# Patient Record
Sex: Male | Born: 1945 | Race: White | Hispanic: No | Marital: Married | Smoking: Never smoker
Health system: Southern US, Community
[De-identification: ages and names within clinical notes are randomized; demographics above are authoritative.]

## PROBLEM LIST (undated history)

## (undated) DIAGNOSIS — E2740 Unspecified adrenocortical insufficiency: Secondary | ICD-10-CM

## (undated) DIAGNOSIS — I10 Essential (primary) hypertension: Secondary | ICD-10-CM

## (undated) DIAGNOSIS — I493 Ventricular premature depolarization: Secondary | ICD-10-CM

## (undated) DIAGNOSIS — H269 Unspecified cataract: Secondary | ICD-10-CM

## (undated) DIAGNOSIS — M545 Low back pain, unspecified: Secondary | ICD-10-CM

## (undated) DIAGNOSIS — D239 Other benign neoplasm of skin, unspecified: Secondary | ICD-10-CM

## (undated) DIAGNOSIS — R7989 Other specified abnormal findings of blood chemistry: Secondary | ICD-10-CM

## (undated) DIAGNOSIS — E7800 Pure hypercholesterolemia, unspecified: Secondary | ICD-10-CM

## (undated) HISTORY — DX: Ventricular premature depolarization: I49.3

## (undated) HISTORY — DX: Unspecified cataract: H26.9

## (undated) HISTORY — DX: Low back pain, unspecified: M54.50

## (undated) HISTORY — DX: Pure hypercholesterolemia, unspecified: E78.00

## (undated) HISTORY — DX: Other benign neoplasm of skin, unspecified: D23.9

## (undated) HISTORY — DX: Other specified abnormal findings of blood chemistry: R79.89

## (undated) HISTORY — PX: RETINAL DETACHMENT SURGERY: SHX105

## (undated) HISTORY — DX: Unspecified adrenocortical insufficiency: E27.40

## (undated) HISTORY — DX: Essential (primary) hypertension: I10

## (undated) HISTORY — DX: Low back pain: M54.5

---

## 2004-08-15 ENCOUNTER — Ambulatory Visit: Payer: Self-pay | Admitting: Gastroenterology

## 2004-08-23 ENCOUNTER — Ambulatory Visit: Payer: Self-pay | Admitting: Gastroenterology

## 2006-02-06 HISTORY — PX: SHOULDER SURGERY: SHX246

## 2007-04-25 ENCOUNTER — Encounter: Admission: RE | Admit: 2007-04-25 | Discharge: 2007-04-25 | Payer: Self-pay | Admitting: Orthopaedic Surgery

## 2008-11-26 ENCOUNTER — Telehealth (INDEPENDENT_AMBULATORY_CARE_PROVIDER_SITE_OTHER): Payer: Self-pay | Admitting: *Deleted

## 2011-02-07 HISTORY — PX: CATARACT EXTRACTION, BILATERAL: SHX1313

## 2011-04-11 ENCOUNTER — Other Ambulatory Visit: Payer: Self-pay | Admitting: Orthopaedic Surgery

## 2011-04-11 DIAGNOSIS — R52 Pain, unspecified: Secondary | ICD-10-CM

## 2011-04-18 ENCOUNTER — Ambulatory Visit
Admission: RE | Admit: 2011-04-18 | Discharge: 2011-04-18 | Disposition: A | Discharge status: Home / Self Care | Payer: BC Managed Care – PPO | Source: Ambulatory Visit | Attending: Orthopaedic Surgery | Admitting: Orthopaedic Surgery

## 2011-04-18 DIAGNOSIS — R52 Pain, unspecified: Secondary | ICD-10-CM

## 2012-01-02 ENCOUNTER — Other Ambulatory Visit: Payer: Self-pay | Admitting: Family Medicine

## 2012-01-02 DIAGNOSIS — M549 Dorsalgia, unspecified: Secondary | ICD-10-CM

## 2012-01-03 ENCOUNTER — Ambulatory Visit
Admission: RE | Admit: 2012-01-03 | Discharge: 2012-01-03 | Disposition: A | Discharge status: Home / Self Care | Payer: BC Managed Care – PPO | Source: Ambulatory Visit | Attending: Family Medicine | Admitting: Family Medicine

## 2012-01-03 DIAGNOSIS — M549 Dorsalgia, unspecified: Secondary | ICD-10-CM

## 2013-02-14 ENCOUNTER — Ambulatory Visit: Payer: BC Managed Care – PPO | Admitting: Cardiovascular Disease

## 2013-03-03 ENCOUNTER — Other Ambulatory Visit: Payer: Self-pay | Admitting: Family Medicine

## 2013-03-03 ENCOUNTER — Ambulatory Visit
Admission: RE | Admit: 2013-03-03 | Discharge: 2013-03-03 | Disposition: A | Discharge status: Home / Self Care | Payer: BC Managed Care – PPO | Source: Ambulatory Visit | Attending: Family Medicine | Admitting: Family Medicine

## 2013-03-03 DIAGNOSIS — R52 Pain, unspecified: Secondary | ICD-10-CM

## 2013-03-25 ENCOUNTER — Ambulatory Visit: Payer: BC Managed Care – PPO | Admitting: Cardiovascular Disease

## 2013-04-23 ENCOUNTER — Ambulatory Visit (INDEPENDENT_AMBULATORY_CARE_PROVIDER_SITE_OTHER): Payer: BC Managed Care – PPO | Admitting: Cardiovascular Disease

## 2013-04-23 ENCOUNTER — Other Ambulatory Visit: Payer: Self-pay | Admitting: *Deleted

## 2013-04-23 ENCOUNTER — Encounter: Payer: Self-pay | Admitting: Cardiovascular Disease

## 2013-04-23 ENCOUNTER — Encounter: Payer: Self-pay | Admitting: *Deleted

## 2013-04-23 VITALS — BP 147/92 | HR 74 | Ht 74.0 in | Wt 211.0 lb

## 2013-04-23 DIAGNOSIS — I493 Ventricular premature depolarization: Secondary | ICD-10-CM

## 2013-04-23 DIAGNOSIS — D239 Other benign neoplasm of skin, unspecified: Secondary | ICD-10-CM | POA: Insufficient documentation

## 2013-04-23 DIAGNOSIS — I10 Essential (primary) hypertension: Secondary | ICD-10-CM | POA: Insufficient documentation

## 2013-04-23 DIAGNOSIS — R5383 Other fatigue: Secondary | ICD-10-CM

## 2013-04-23 DIAGNOSIS — M545 Low back pain, unspecified: Secondary | ICD-10-CM | POA: Insufficient documentation

## 2013-04-23 DIAGNOSIS — E7800 Pure hypercholesterolemia, unspecified: Secondary | ICD-10-CM | POA: Insufficient documentation

## 2013-04-23 DIAGNOSIS — R5381 Other malaise: Secondary | ICD-10-CM

## 2013-04-23 DIAGNOSIS — R0609 Other forms of dyspnea: Secondary | ICD-10-CM

## 2013-04-23 DIAGNOSIS — I4949 Other premature depolarization: Secondary | ICD-10-CM

## 2013-04-23 DIAGNOSIS — R7989 Other specified abnormal findings of blood chemistry: Secondary | ICD-10-CM | POA: Insufficient documentation

## 2013-04-23 DIAGNOSIS — E2740 Unspecified adrenocortical insufficiency: Secondary | ICD-10-CM | POA: Insufficient documentation

## 2013-04-23 DIAGNOSIS — R0989 Other specified symptoms and signs involving the circulatory and respiratory systems: Secondary | ICD-10-CM

## 2013-04-23 DIAGNOSIS — R0600 Dyspnea, unspecified: Secondary | ICD-10-CM

## 2013-04-23 NOTE — Assessment & Plan Note (Signed)
Chronic  Check echo to make sure EF normal and ETT to make sure they don't increase in frequency with exertion

## 2013-04-23 NOTE — Assessment & Plan Note (Signed)
Cholesterol is at goal.  Continue current dose of statin and diet Rx.  No myalgias or side effects.  F/U  LFT's in 6 months. No results found for this basename: LDLCALC   Last LDL 143  Continue diet Rx  F/U Dr Harrington Challenger

## 2013-04-23 NOTE — Assessment & Plan Note (Signed)
Low sodium diet continue current meds  Follow K and Mg regarding PVCls  He is not interested in beta blocker  If home BP's run high he will call to adjust meds

## 2013-04-23 NOTE — Patient Instructions (Signed)
Your physician wants you to follow-up in:  YEAR WITH DR NISHAN You will receive a reminder letter in the mail two months in advance. If you don't receive a letter, please call our office to schedule the follow-up appointment. Your physician recommends that you continue on your current medications as directed. Please refer to the Current Medication list given to you today. Your physician has requested that you have an exercise tolerance test. For further information please visit [email or web address]. Please also follow instruction sheet, as given.  Your physician has requested that you have an echocardiogram. Echocardiography is a painless test that uses sound waves to create images of your heart. It provides your doctor with information about the size and shape of your heart and how well your heart's chambers and valves are working. This procedure takes approximately one hour. There are no restrictions for this procedure.  

## 2013-04-23 NOTE — Progress Notes (Signed)
Patient ID: Alexander Rea., male   DOB: 04-30-1945, 68 y.o.   MRN: [id number]  68 yo previously seen by Dr Marlou Porch.  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 Recent increase in fatigue and exertional dyspnea.  No real chest pain but mild pressure when he does yard work and gets dyspnic.  Having issues with allergies now taking antihistamines  Chronic HTN on Rx White coat component Runs good at home  Denies excess NSAI's , ETOH  Compliant with meds.  Does not note PVCls no palpitations or syncope.  Still working in Production assistant, radio Like to ride [place].  Works out pretty regularly with no chest pain but increasing fatigue and dyspnea last couple of months     ROS: Denies fever, malais, weight loss, blurry vision, decreased visual acuity, cough, sputum, SOB, hemoptysis, pleuritic pain, palpitaitons, heartburn, abdominal pain, melena, lower extremity edema, claudication, or rash.  All other systems reviewed and negative   General: Affect appropriate Healthy:  appears stated age 23: normal Neck supple with no adenopathy JVP normal no bruits no thyromegaly Lungs clear with no wheezing and good diaphragmatic motion Heart:  S1/S2 no murmur,rub, gallop or click PMI normal Abdomen: benighn, BS positve, no tenderness, no AAA no bruit.  No HSM or HJR Distal pulses intact with no bruits No edema Neuro non-focal Skin warm and dry No muscular weakness  Medications Current Outpatient Prescriptions  Medication Sig Dispense Refill  . ANDROGEL 50 MG/5GM (1%) GEL       . DIOVAN HCT 320-25 MG per tablet       . valACYclovir (VALTREX) 1000 MG tablet        No current facility-administered medications for this visit.    Allergies Ciprofloxacin and Penicillins  Family History: No family history on file.  Social History: History   Social History  . Marital Status: Married    Spouse Name: N/A    Number of Children: N/A  . Years of Education: N/A    Occupational History  . Not on file.   Social History Main Topics  . Smoking status: Never Smoker   . Smokeless tobacco: Not on file  . Alcohol Use: Not on file  . Drug Use: Not on file  . Sexual Activity: Not on file   Other Topics Concern  . Not on file   Social History Narrative  . No narrative on file    Electrocardiogram:   SR rate 76  LAD PVC  QT 374    Assessment and Plan

## 2013-05-08 ENCOUNTER — Other Ambulatory Visit: Payer: Self-pay | Admitting: *Deleted

## 2013-05-08 ENCOUNTER — Encounter: Payer: Self-pay | Admitting: Cardiovascular Disease

## 2013-05-08 ENCOUNTER — Ambulatory Visit (INDEPENDENT_AMBULATORY_CARE_PROVIDER_SITE_OTHER): Payer: BC Managed Care – PPO | Admitting: Nurse Practitioner

## 2013-05-08 ENCOUNTER — Encounter: Payer: Self-pay | Admitting: Nurse Practitioner

## 2013-05-08 ENCOUNTER — Ambulatory Visit (HOSPITAL_COMMUNITY): Payer: BC Managed Care – PPO | Attending: Cardiovascular Disease | Admitting: Radiology

## 2013-05-08 VITALS — BP 128/84 | HR 78

## 2013-05-08 DIAGNOSIS — I493 Ventricular premature depolarization: Secondary | ICD-10-CM

## 2013-05-08 DIAGNOSIS — R0989 Other specified symptoms and signs involving the circulatory and respiratory systems: Secondary | ICD-10-CM | POA: Insufficient documentation

## 2013-05-08 DIAGNOSIS — R0600 Dyspnea, unspecified: Secondary | ICD-10-CM

## 2013-05-08 DIAGNOSIS — R5381 Other malaise: Secondary | ICD-10-CM

## 2013-05-08 DIAGNOSIS — I4949 Other premature depolarization: Secondary | ICD-10-CM | POA: Insufficient documentation

## 2013-05-08 DIAGNOSIS — R0609 Other forms of dyspnea: Secondary | ICD-10-CM

## 2013-05-08 DIAGNOSIS — R0602 Shortness of breath: Secondary | ICD-10-CM

## 2013-05-08 DIAGNOSIS — R5383 Other fatigue: Secondary | ICD-10-CM

## 2013-05-08 NOTE — Progress Notes (Signed)
Exercise Treadmill Test  Pre-Exercise Testing Evaluation Rhythm: normal sinus  Rate: 63 bpm     Test  Exercise Tolerance Test Ordering MD: Jenkins Rouge, MD  Interpreting MD: Truitt Merle, NP  Unique Test No: 1  Treadmill:  1  Indication for ETT: exertional dyspnea  Contraindication to ETT: No   Stress Modality: exercise - treadmill  Cardiac Imaging Performed: non   Protocol: standard Bruce - maximal  Max BP:  221/105  Max MPHR (bpm):  155 85% MPR (bpm):  132  MPHR obtained (bpm):  153 % MPHR obtained:  97%  Reached 85% MPHR (min:sec):  7:20 Total Exercise Time (min-sec):  10 minutes  Workload in METS:  11.7 Borg Scale: 17  Reason ETT Terminated:  desired heart rate attained    ST Segment Analysis At Rest: normal ST segments - no evidence of significant ST depression With Exercise: no evidence of significant ST depression  Other Information Arrhythmia:  No Angina during ETT:  absent (0) Quality of ETT:  diagnostic  ETT Interpretation:  normal - no evidence of ischemia by ST analysis  Comments: Patient presents today for routine GXT. Has had chronic PVCs, no known CAD - reports fatigue with exertion.  Has had echo earlier this AM.   Today the patient exercised on the standard Bruce protocol for a total of 10 minutes.  Excellent exercise tolerance.  Mildly hypertensive blood pressure response.  Clinically negative for chest pain. Test was stopped due to achievement of target HR.  EKG negative for ischemia. No significant arrhythmia noted. Does have frequent PVCs at rest, less with exertion, and then noted to recur in recovery. Unifocal in origin.    Recommendations: CV risk factor modification Echo results pending Monitor BP at home.  Follow up as planned  Patient is agreeable to this plan and will call if any problems develop in the interim.   Burtis Junes, RN, [place] [address] [place], [place]   [phone]

## 2013-05-08 NOTE — Telephone Encounter (Signed)
Pt was seen in treadmill room with Truitt Merle, NP and wanted to add pt's supplements:  Osteo Bi-Flex; (1500) gluco simine, (1103) chrondrotin, 2 daily Milk Thistle (240 mg ) daily Fish oil ( 1200 mg) daily  Vit E (1000 mg) daily Vit B (2000) iu daily L-Lysine (1000 mg ) daily Lecithin ( 1200 mg ) daily

## 2013-05-08 NOTE — Progress Notes (Signed)
Echocardiogram Performed. 

## 2013-05-30 ENCOUNTER — Other Ambulatory Visit: Payer: Self-pay | Admitting: Orthopaedic Surgery

## 2013-05-30 DIAGNOSIS — M25561 Pain in right knee: Secondary | ICD-10-CM

## 2013-06-04 ENCOUNTER — Ambulatory Visit
Admission: RE | Admit: 2013-06-04 | Discharge: 2013-06-04 | Disposition: A | Discharge status: Home / Self Care | Payer: BC Managed Care – PPO | Source: Ambulatory Visit | Attending: Orthopaedic Surgery | Admitting: Orthopaedic Surgery

## 2013-06-04 DIAGNOSIS — M25561 Pain in right knee: Secondary | ICD-10-CM

## 2014-04-23 ENCOUNTER — Encounter: Payer: Self-pay | Admitting: Cardiovascular Disease

## 2014-04-23 ENCOUNTER — Ambulatory Visit (INDEPENDENT_AMBULATORY_CARE_PROVIDER_SITE_OTHER): Payer: Managed Care, Other (non HMO) | Admitting: Cardiovascular Disease

## 2014-04-23 VITALS — BP 148/92 | HR 76 | Ht 74.0 in | Wt 211.8 lb

## 2014-04-23 DIAGNOSIS — E7800 Pure hypercholesterolemia, unspecified: Secondary | ICD-10-CM

## 2014-04-23 DIAGNOSIS — I493 Ventricular premature depolarization: Secondary | ICD-10-CM

## 2014-04-23 DIAGNOSIS — I10 Essential (primary) hypertension: Secondary | ICD-10-CM

## 2014-04-23 NOTE — Patient Instructions (Signed)
Your physician wants you to follow-up in: YEAR WITH DR NISHAN  You will receive a reminder letter in the mail two months in advance. If you don't receive a letter, please call our office to schedule the follow-up appointment.  Your physician recommends that you continue on your current medications as directed. Please refer to the Current Medication list given to you today. 

## 2014-04-23 NOTE — Assessment & Plan Note (Signed)
Cholesterol is at goal.  Continue current dose of statin and diet Rx.  No myalgias or side effects.  F/U  LFT's in 6 months. No results found for: [facility]          Labs with primary

## 2014-04-23 NOTE — Progress Notes (Signed)
Patient ID: Alexander Rea., male   DOB: 04-30-1945, 69 y.o.   MRN: [id number]  69 yo previously seen by Dr Marlou Porch.  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 Recent increase in fatigue and exertional dyspnea.  No real chest pain but mild pressure when he does yard work and gets dyspnic.  Having issues with allergies now taking antihistamines  Chronic HTN on Rx White coat component Runs good at home  Denies excess NSAI's , ETOH  Compliant with meds.  Does not note PVCls no palpitations or syncope.  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  Works out pretty regularly with no chest pain but increasing fatigue and dyspnea last couple of months   F/U echo :  Reviewed  Normal  Study Conclusions  Left ventricle: The cavity size was normal. Systolic function was normal. The estimated ejection fraction was in the range of 60% to 65%. Wall motion was normal; there were no regional wall motion abnormalities.  ETT 05/08/13  Normal exercised 10 mintues  Torn ligament left foot has set him back a bit  Seeing Alexander Anderson   ROS: Denies fever, malais, weight loss, blurry vision, decreased visual acuity, cough, sputum, SOB, hemoptysis, pleuritic pain, palpitaitons, heartburn, abdominal pain, melena, lower extremity edema, claudication, or rash.  All other systems reviewed and negative   General: Affect appropriate Healthy:  appears stated age 68: normal Neck supple with no adenopathy JVP normal no bruits no thyromegaly Lungs clear with no wheezing and good diaphragmatic motion Heart:  S1/S2 no murmur,rub, gallop or click PMI normal Abdomen: benighn, BS positve, no tenderness, no AAA no bruit.  No HSM or HJR Distal pulses intact with no bruits No edema Neuro non-focal Skin warm and dry No muscular weakness  Medications Current Outpatient Prescriptions  Medication Sig Dispense Refill  . ANDROGEL 50 MG/5GM (1%) GEL Place onto the skin daily.     Marland Kitchen DIOVAN  HCT 320-25 MG per tablet Take 1 tablet by mouth daily.     . valACYclovir (VALTREX) 1000 MG tablet Take by mouth as needed (for cold sores).      No current facility-administered medications for this visit.    Allergies Ciprofloxacin and Penicillins  Family History: Family History  Problem Relation Age of Onset  . Atrial fibrillation Mother   . Ovarian cancer Mother   . Heart disease Sister     Social History: History   Social History  . Marital Status: Married    Spouse Name: N/A  . Number of Children: N/A  . Years of Education: N/A   Occupational History  . Not on file.   Social History Main Topics  . Smoking status: Never Smoker   . Smokeless tobacco: Not on file  . Alcohol Use: Not on file  . Drug Use: Not on file  . Sexual Activity: Not on file   Other Topics Concern  . Not on file   Social History Narrative    Electrocardiogram:    2015  SR rate 76  LAD PVC  QT 374   04/23/14  Same no change   Assessment and Plan

## 2014-04-23 NOTE — Assessment & Plan Note (Signed)
Well controlled.  Continue current medications and low sodium Dash type diet.    

## 2014-04-23 NOTE — Assessment & Plan Note (Signed)
Resolved normal echo and ETT observe

## 2014-06-05 ENCOUNTER — Encounter: Payer: Self-pay | Admitting: Gastroenterology

## 2014-06-22 ENCOUNTER — Encounter: Payer: Self-pay | Admitting: Gastroenterology

## 2014-08-18 ENCOUNTER — Ambulatory Visit (AMBULATORY_SURGERY_CENTER): Payer: Self-pay

## 2014-08-18 VITALS — Ht 74.0 in | Wt 207.6 lb

## 2014-08-18 DIAGNOSIS — Z1211 Encounter for screening for malignant neoplasm of colon: Secondary | ICD-10-CM

## 2014-08-18 MED ORDER — NA SULFATE-K SULFATE-MG SULF 17.5-3.13-1.6 GM/177ML PO SOLN: ORAL | Status: DC

## 2014-08-18 NOTE — Progress Notes (Signed)
Per pt, no allergies to soy or egg products.Pt not taking any weight loss meds or using  O2 at home. 

## 2014-08-19 ENCOUNTER — Encounter: Payer: Self-pay | Admitting: Gastroenterology

## 2014-09-03 ENCOUNTER — Ambulatory Visit (AMBULATORY_SURGERY_CENTER): Payer: Managed Care, Other (non HMO) | Admitting: Gastroenterology

## 2014-09-03 ENCOUNTER — Encounter: Payer: Self-pay | Admitting: Gastroenterology

## 2014-09-03 VITALS — BP 138/82 | HR 69 | Temp 97.7°F | Resp 20 | Ht 74.0 in | Wt 207.0 lb

## 2014-09-03 DIAGNOSIS — Z1211 Encounter for screening for malignant neoplasm of colon: Secondary | ICD-10-CM | POA: Diagnosis present

## 2014-09-03 MED ORDER — SODIUM CHLORIDE 0.9 % IV SOLN: 500.0000 mL | INTRAVENOUS | Status: DC

## 2014-09-03 NOTE — Progress Notes (Signed)
Transferred to recovery room. A/O x3, pleased with MAC.  VSS.  Report to Annette, RN. 

## 2014-09-03 NOTE — Progress Notes (Signed)
No problems noted in the recovery room. maw 

## 2014-09-03 NOTE — Op Note (Addendum)
[place]  [address] [place], [address]   COLONOSCOPY PROCEDURE REPORT  PATIENT: Alexander Anderson, Alexander Anderson  MR#: [id number] BIRTHDATE: [birth year] , 80  yrs. old GENDER: male ENDOSCOPIST: Inda Castle, MD REFERRED BY: PROCEDURE DATE:  09/03/2014 PROCEDURE:   Colonoscopy, screening First Screening Colonoscopy - Avg.  risk and is 50 yrs.  old or older - No.  Prior Negative Screening - Now for repeat screening. 10 or more years since last screening  History of Adenoma - Now for follow-up colonoscopy & has been > or = to 3 yrs.  N/A  Polyps removed today? No Recommend repeat exam, <10 yrs? No ASA CLASS:   Class II INDICATIONS:Colorectal Neoplasm Risk Assessment for this procedure is average risk. MEDICATIONS: Monitored anesthesia care and Propofol 250 mg IV  DESCRIPTION OF PROCEDURE:   After the risks benefits and alternatives of the procedure were thoroughly explained, informed consent was obtained.  The digital rectal exam revealed no abnormalities of the rectum.   The LB [id number]  endoscope was introduced through the anus and advanced to the cecum, which was identified by both the appendix and ileocecal valve. No adverse events experienced.   The quality of the prep was (Suprep was used) excellent.  The instrument was then slowly withdrawn as the colon was fully examined. Estimated blood loss is zero unless otherwise noted in this procedure report.      COLON FINDINGS: A normal appearing cecum, ileocecal valve, and appendiceal orifice were identified.  The ascending, transverse, descending, sigmoid colon, and rectum appeared unremarkable. Retroflexed views revealed No abnormalities. The time to cecum = 4.4 Withdrawal time = 6.5   The scope was withdrawn and the procedure completed. COMPLICATIONS: There were no immediate complications.  ENDOSCOPIC IMPRESSION:   No normal colonoscopy  RECOMMENDATIONS: Continue current colorectal screening  recommendations for "routine risk" patients with a repeat colonoscopy in 10 years.  eSigned:  Inda Castle, MD 09/03/2014 8:58 AM Revised: 09/03/2014 8:58 AM  cc: Lona Kettle, MD

## 2014-09-03 NOTE — Patient Instructions (Addendum)
YOU HAD AN ENDOSCOPIC PROCEDURE TODAY AT [facility]:   Refer to the procedure report that was given to you for any specific questions about what was found during the examination.  If the procedure report does not answer your questions, please call your gastroenterologist to clarify.  If you requested that your care partner not be given the details of your procedure findings, then the procedure report has been included in a sealed envelope for you to review at your convenience later.  YOU SHOULD EXPECT: Some feelings of bloating in the abdomen. Passage of more gas than usual.  Walking can help get rid of the air that was put into your GI tract during the procedure and reduce the bloating. If you had a lower endoscopy (such as a colonoscopy or flexible sigmoidoscopy) you may notice spotting of blood in your stool or on the toilet paper. If you underwent a bowel prep for your procedure, you may not have a normal bowel movement for a few days.  Please Note:  You might notice some irritation and congestion in your nose or some drainage.  This is from the oxygen used during your procedure.  There is no need for concern and it should clear up in a day or so.  SYMPTOMS TO REPORT IMMEDIATELY:   Following lower endoscopy (colonoscopy or flexible sigmoidoscopy):  Excessive amounts of blood in the stool  Significant tenderness or worsening of abdominal pains  Swelling of the abdomen that is new, acute  Fever of 100F or higher  For urgent or emergent issues, a gastroenterologist can be reached at any hour by calling [phone].   DIET: Your first meal following the procedure should be a small meal and then it is ok to progress to your normal diet. Heavy or fried foods are harder to digest and may make you feel nauseous or bloated.  Likewise, meals heavy in dairy and vegetables can increase bloating.  Drink plenty of fluids but you should avoid alcoholic beverages for 24  hours.  ACTIVITY:  You should plan to take it easy for the rest of today and you should NOT DRIVE or use heavy machinery until tomorrow (because of the sedation medicines used during the test).    FOLLOW UP: Our staff will call the number listed on your records the next business day following your procedure to check on you and address any questions or concerns that you may have regarding the information given to you following your procedure. If we do not reach you, we will leave a message.  However, if you are feeling well and you are not experiencing any problems, there is no need to return our call.  We will assume that you have returned to your regular daily activities without incident.  If any biopsies were taken you will be contacted by phone or by letter within the next 1-3 weeks.  Please call us at [phone] if you have not heard about the biopsies in 3 weeks.    SIGNATURES/CONFIDENTIALITY: You and/or your care partner have signed paperwork which will be entered into your electronic medical record.  These signatures attest to the fact that that the information above on your After Visit Summary has been reviewed and is understood.  Full responsibility of the confidentiality of this discharge information lies with you and/or your care-partner.    You may resume your current medications today. Please call if any questions or concerns.

## 2014-09-04 ENCOUNTER — Telehealth: Payer: Self-pay | Admitting: Emergency Medicine

## 2014-09-04 NOTE — Telephone Encounter (Signed)
  Follow up Call-  Call back number 09/03/2014  Post procedure Call Back phone  # [phone]  Permission to leave phone message Yes     Patient questions:  Do you have a fever, pain , or abdominal swelling? No. Pain Score  0 *  Have you tolerated food without any problems? Yes.    Have you been able to return to your normal activities? Yes.    Do you have any questions about your discharge instructions: Diet   No. Medications  No. Follow up visit  No.  Do you have questions or concerns about your Care? No.  Actions: * If pain score is 4 or above: No action needed, pain <4.

## 2014-09-08 ENCOUNTER — Other Ambulatory Visit: Payer: Self-pay | Admitting: Otolaryngology

## 2014-09-08 ENCOUNTER — Ambulatory Visit
Admission: RE | Admit: 2014-09-08 | Discharge: 2014-09-08 | Disposition: A | Discharge status: Home / Self Care | Payer: Managed Care, Other (non HMO) | Source: Ambulatory Visit | Attending: Otolaryngology | Admitting: Otolaryngology

## 2014-09-08 DIAGNOSIS — J40 Bronchitis, not specified as acute or chronic: Secondary | ICD-10-CM

## 2015-03-15 ENCOUNTER — Telehealth: Payer: Self-pay | Admitting: Oncology

## 2015-03-15 NOTE — Telephone Encounter (Signed)
Lt mess regarding new pt referral.  °

## 2015-03-18 ENCOUNTER — Ambulatory Visit: Payer: Managed Care, Other (non HMO) | Admitting: Oncology

## 2015-03-23 ENCOUNTER — Encounter: Payer: Self-pay | Admitting: Hematology

## 2015-03-23 ENCOUNTER — Ambulatory Visit (HOSPITAL_BASED_OUTPATIENT_CLINIC_OR_DEPARTMENT_OTHER): Payer: Managed Care, Other (non HMO)

## 2015-03-23 ENCOUNTER — Ambulatory Visit (HOSPITAL_BASED_OUTPATIENT_CLINIC_OR_DEPARTMENT_OTHER): Payer: Managed Care, Other (non HMO) | Admitting: Hematology

## 2015-03-23 ENCOUNTER — Telehealth: Payer: Self-pay | Admitting: Hematology

## 2015-03-23 VITALS — BP 150/86 | HR 71 | Temp 98.2°F | Resp 18 | Ht 74.0 in | Wt 209.6 lb

## 2015-03-23 DIAGNOSIS — D72821 Monocytosis (symptomatic): Secondary | ICD-10-CM

## 2015-03-23 DIAGNOSIS — N289 Disorder of kidney and ureter, unspecified: Secondary | ICD-10-CM | POA: Diagnosis not present

## 2015-03-23 DIAGNOSIS — I10 Essential (primary) hypertension: Secondary | ICD-10-CM

## 2015-03-23 LAB — COMPREHENSIVE METABOLIC PANEL
ALT: 23 U/L (ref 0–55)
AST: 21 U/L (ref 5–34)
Albumin: 3.9 g/dL (ref 3.5–5.0)
Alkaline Phosphatase: 69 U/L (ref 40–150)
Anion Gap: 9 mEq/L (ref 3–11)
BUN: 16.2 mg/dL (ref 7.0–26.0)
CO2: 29 mEq/L (ref 22–29)
Calcium: 9.7 mg/dL (ref 8.4–10.4)
Chloride: 102 mEq/L (ref 98–109)
Creatinine: 1.1 mg/dL (ref 0.7–1.3)
EGFR: 70 mL/min/{1.73_m2} — ABNORMAL LOW (ref >90)
Glucose: 92 mg/dl (ref 70–140)
Potassium: 4.2 mEq/L (ref 3.5–5.1)
Sodium: 140 mEq/L (ref 136–145)
Total Bilirubin: 0.46 mg/dL (ref 0.20–1.20)
Total Protein: 7.4 g/dL (ref 6.4–8.3)

## 2015-03-23 LAB — CBC & DIFF AND RETIC
BASO%: 0.4 % (ref 0.0–2.0)
Basophils Absolute: 0 10*3/uL (ref 0.0–0.1)
EOS%: 2.7 % (ref 0.0–7.0)
Eosinophils Absolute: 0.2 10*3/uL (ref 0.0–0.5)
HCT: 46.5 % (ref 38.4–49.9)
HGB: 15.8 g/dL (ref 13.0–17.1)
Immature Retic Fract: 3.4 % (ref 3.00–10.60)
LYMPH%: 21.5 % (ref 14.0–49.0)
MCH: 31.3 pg (ref 27.2–33.4)
MCHC: 34 g/dL (ref 32.0–36.0)
MCV: 92.3 fL (ref 79.3–98.0)
MONO#: 0.8 10*3/uL (ref 0.1–0.9)
MONO%: 11.6 % (ref 0.0–14.0)
NEUT#: 4.5 10*3/uL (ref 1.5–6.5)
NEUT%: 63.8 % (ref 39.0–75.0)
Platelets: 207 10*3/uL (ref 140–400)
RBC: 5.04 10*6/uL (ref 4.20–5.82)
RDW: 12.9 % (ref 11.0–14.6)
Retic %: 1.51 % (ref 0.80–1.80)
Retic Ct Abs: 76.1 10*3/uL (ref 34.80–93.90)
WBC: 7.1 10*3/uL (ref 4.0–10.3)
lymph#: 1.5 10*3/uL (ref 0.9–3.3)

## 2015-03-23 LAB — CHCC SMEAR

## 2015-03-23 NOTE — Progress Notes (Signed)
Marland Kitchen    HEMATOLOGY/ONCOLOGY CONSULTATION NOTE  Date of Service: 03/23/2015  Patient Care Team: Lona Kettle, MD as PCP - General (Family Medicine)  CHIEF COMPLAINTS/PURPOSE OF CONSULTATION:  Monocytosis  HISTORY OF PRESENTING ILLNESS:  Alexander Biehn. is a wonderful 70 y.o. male who has been referred to [place] by Dr . Melinda Crutch, MD for evaluation and management of monocytosis.  Alexander Anderson has a history of HTN, HLD, low testosterone who has been noted to have increase monocyte percentage on his WBC differential for several years. His absolute monocyte counts have varied between the highest number of 1200 several years ago to recently in 700 to 900 range with normal total WBC counts and normal Hgb and platelets. Patient has been on testosterone replacement for several years and based on his personal lab records his monocyte counts were higher previously when he was on a higher dose of testosterone and his hgb and serum testosterone levels were higher as well. The monocyte counts appears to reflect the degree of erythropoiesis as a function of BM bx erythroid hyperplasia under the effects of testosterone.  He notes that he feels well overall. No bone pain. Good energy wells though he feels a little more fatigues as he is gradually aging. No other acute new symptoms.  MEDICAL HISTORY:  Past Medical History  Diagnosis Date  . HTN (hypertension)     Benign  . PVC's (premature ventricular contractions)     STRESS TEST IN [facility]  . Hypercholesterolemia   . Adrenal insufficiency ([place])   . Lower back pain   . Dysplastic nevus     [name]  . Low testosterone   . Cataract    Gout flare  SURGICAL HISTORY: Past Surgical History  Procedure Laterality Date  . Shoulder surgery  2008    Left, right  . Cataract extraction, bilateral  2013    Bil  . Retinal detachment surgery      RIGHT EYE    SOCIAL HISTORY: Social History   Social History  . Marital Status: Married    Spouse Name:  N/A  . Number of Children: N/A  . Years of Education: N/A   Occupational History  . Not on file.   Social History Main Topics  . Smoking status: Never Smoker   . Smokeless tobacco: Never Used  . Alcohol Use: 4.2 oz/week    7 Standard drinks or equivalent per week  . Drug Use: No  . Sexual Activity: Not on file   Other Topics Concern  . Not on file   Social History Narrative    FAMILY HISTORY: Family History  Problem Relation Age of Onset  . Atrial fibrillation Mother   . Ovarian cancer Mother   . Heart disease Sister     ALLERGIES:  is allergic to ciprofloxacin and penicillins.  MEDICATIONS:  Current Outpatient Prescriptions  Medication Sig Dispense Refill  . ANDROGEL 50 MG/5GM (1%) GEL Place onto the skin daily.     . cholecalciferol (VITAMIN D) 1000 UNITS tablet Take 1,000 Units by mouth daily.    Marland Kitchen DIOVAN HCT 320-25 MG per tablet Take 1 tablet by mouth daily.     Marland Kitchen glucosamine-chondroitin 500-400 MG tablet Take 1 tablet by mouth daily.    Marland Kitchen L-LYSINE PO Take by mouth daily.    . milk thistle 175 MG tablet Take 175 mg by mouth daily.    . Omega-3 Fatty Acids (FISH OIL) 1000 MG CAPS Take by mouth daily.    Marland Kitchen  valACYclovir (VALTREX) 1000 MG tablet Take by mouth as needed (for cold sores).     . vitamin E 100 UNIT capsule Take by mouth daily.     No current facility-administered medications for this visit.    REVIEW OF SYSTEMS:    10 Point review of Systems was done is negative except as noted above.  PHYSICAL EXAMINATION: ECOG PERFORMANCE STATUS: 0 - Asymptomatic  . Filed Vitals:   03/23/15 1405  BP: 150/86  Pulse: 71  Temp: 98.2 F (36.8 C)  Resp: 18   Filed Weights   03/23/15 1405  Weight: 209 lb 9.6 oz (95.074 kg)   .Body mass index is 26.9 kg/(m^2).  GENERAL:alert, in no acute distress and comfortable SKIN: skin color, texture, turgor are normal, no rashes or significant lesions EYES: normal, conjunctiva are pink and non-injected, sclera  clear OROPHARYNX:no exudate, no erythema and lips, buccal mucosa, and tongue normal  NECK: supple, no JVD, thyroid normal size, non-tender, without nodularity LYMPH:  no palpable lymphadenopathy in the cervical, axillary or inguinal LUNGS: clear to auscultation with normal respiratory effort HEART: regular rate & rhythm,  no murmurs and no lower extremity edema ABDOMEN: abdomen soft, non-tender, normoactive bowel sounds , no palpable hepatosplenomegaly. Musculoskeletal: no cyanosis of digits and no clubbing  PSYCH: alert & oriented x 3 with fluent speech NEURO: no focal motor/sensory deficits  LABORATORY DATA:  I have reviewed the data as listed  . CBC Latest Ref Rng 03/23/2015  WBC 4.0 - 10.3 10e3/uL 7.1  Hemoglobin 13.0 - 17.1 g/dL 15.8  Hematocrit 38.4 - 49.9 % 46.5  Platelets 140 - 400 10e3/uL 207   . CBC    Component Value Date/Time   WBC 7.1 03/23/2015 1528   RBC 5.04 03/23/2015 1528   HGB 15.8 03/23/2015 1528   HCT 46.5 03/23/2015 1528   PLT 207 03/23/2015 1528   MCV 92.3 03/23/2015 1528   MCH 31.3 03/23/2015 1528   MCHC 34.0 03/23/2015 1528   RDW 12.9 03/23/2015 1528   LYMPHSABS 1.5 03/23/2015 1528   MONOABS 0.8 03/23/2015 1528   EOSABS 0.2 03/23/2015 1528   BASOSABS 0.0 03/23/2015 1528    . CMP Latest Ref Rng 03/23/2015  Glucose 70 - 140 mg/dl 92  BUN 7.0 - 26.0 mg/dL 16.2  Creatinine 0.7 - 1.3 mg/dL 1.1  Sodium 136 - 145 mEq/L 140  Potassium 3.5 - 5.1 mEq/L 4.2  CO2 22 - 29 mEq/L 29  Calcium 8.4 - 10.4 mg/dL 9.7  Total Protein 6.4 - 8.3 g/dL 7.4  Total Bilirubin 0.20 - 1.20 mg/dL 0.46  Alkaline Phos 40 - 150 U/L 69  AST 5 - 34 U/L 21  ALT 0 - 55 U/L 23      RADIOGRAPHIC STUDIES: I have personally reviewed the radiological images as listed and agreed with the findings in the report. No results found.  ASSESSMENT & PLAN:    70 yo caucasian male referred for evaluate of monocytes  1) Monocytosis - likely benign and probably an association with  increased bone marrow erythroid hyperplasia as a function of testosterone replacement. His monocyte count were 1200 at their highest which was several yrs ago when Alexander Anderson was on a higher dose of testosterone and had higher hgb levels. Currently all his blood counts hgb, plt , wbc and monocyte numbers are WNL. No evidence of MPN or MPN/MDS like CMML at this time. Patient feels and looks well. No issues with chronic inflammation or viral infection. Plan -no indication for  additional hematologic workup with a bone marrow biopsy at this time. -if the patient develops increasing monocytosis especially if accompanied with anemia or other cell line disturbances then might need additional attention and consideration of BM Bx to r/o MDS/MPN -patient has been trying to use the lowest dose of testosterone that's adequate and is currently avoid polycythemia that can result from testosterone replacement. -counseled on need to maintain a healthy lifestyle -we will review his PBS  2) . Patient Active Problem List   Diagnosis Date Noted  . 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 HTN (hypertension)   . PVC's (premature ventricular contractions)   . Hypercholesterolemia   . Adrenal insufficiency ([place])   . Lower back pain   . Dysplastic nevus   . Low testosterone    -continue f/u with primary care physician.  RTC with Dr Irene Limbo as needed if any additional questions/concerns arise. Continue f/u with pcp for ongoing cares   All of the patients questions were answered to his apparent satisfaction. The patient knows to call the clinic with any problems, questions or concerns.  I spent 45 minutes counseling the patient face to face. The total time spent in the appointment was 50 minutes and more than 50% was on counseling and direct patient cares.    Sullivan Lone MD [place] AAHIVMS [facility] Hematology/Oncology Physician [facility]  (Office):       [phone] (Work cell):   [phone] (Fax):           [phone]  03/23/2015 2:29 PM

## 2015-03-23 NOTE — Telephone Encounter (Signed)
per pof to sch pt appt-gave pt copy of avs °

## 2015-04-29 NOTE — Progress Notes (Signed)
Patient ID: Alexander Rea., male   DOB: 1945-07-12, 70 y.o.   MRN: [id number]  70 y.o.  previously seen by Dr Marlou Porch.  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 Recent increase in fatigue and exertional dyspnea.  No real chest pain but mild pressure when he does yard work and gets dyspnic.  Having issues with allergies now taking antihistamines  Chronic HTN on Rx White coat component Runs good at home  Denies excess NSAI's , ETOH  Compliant with meds.  Does not note PVCls no palpitations or syncope.  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  Works out pretty regularly with no chest pain but increasing fatigue and dyspnea last couple of months   F/U echo :  Reviewed  Normal  Study Conclusions  Left ventricle: The cavity size was normal. Systolic function was normal. The estimated ejection fraction was in the range of 60% to 65%. Wall motion was normal; there were no regional wall motion abnormalities.  ETT 05/08/13  Normal exercised 10 mintues  Torn ligament left foot has set him back a bit  Seeing [place]  Recent flu Had some PVC;s in office that where asymptomatic     ROS: Denies fever, malais, weight loss, blurry vision, decreased visual acuity, cough, sputum, SOB, hemoptysis, pleuritic pain, palpitaitons, heartburn, abdominal pain, melena, lower extremity edema, claudication, or rash.  All other systems reviewed and negative   General: Affect appropriate Healthy:  appears stated age 29: normal Neck supple with no adenopathy JVP normal no bruits no thyromegaly Lungs clear with no wheezing and good diaphragmatic motion Heart:  S1/S2 no murmur,rub, gallop or click PMI normal Abdomen: benighn, BS positve, no tenderness, no AAA no bruit.  No HSM or HJR Distal pulses intact with no bruits No edema Neuro non-focal Skin warm and dry No muscular weakness  Medications Current Outpatient Prescriptions  Medication Sig Dispense Refill  .  ANDROGEL 50 MG/5GM (1%) GEL Place 5 g onto the skin daily.     . cholecalciferol (VITAMIN D) 1000 UNITS tablet Take 1,000 Units by mouth daily.    Marland Kitchen DIOVAN HCT 320-25 MG per tablet Take 1 tablet by mouth daily.     Marland Kitchen glucosamine-chondroitin 500-400 MG tablet Take 1 tablet by mouth daily.    Marland Kitchen guaiFENesin-codeine 100-10 MG/5ML syrup Take 5 mLs by mouth daily as needed. For cough    . hydrochlorothiazide (HYDRODIURIL) 25 MG tablet Take 25 mg by mouth daily.    . irbesartan (AVAPRO) 300 MG tablet Take 300 mg by mouth daily.    Marland Kitchen L-LYSINE PO Take 1 tablet by mouth daily.     . milk thistle 175 MG tablet Take 175 mg by mouth daily.    . Omega-3 Fatty Acids (FISH OIL) 1000 MG CAPS Take 1 capsule by mouth daily.     . valACYclovir (VALTREX) 1000 MG tablet Take 1,000 mg by mouth as directed.     . vitamin E 100 UNIT capsule Take 100 Units by mouth daily.      No current facility-administered medications for this visit.    Allergies Ciprofloxacin and Penicillins  Family History: Family History  Problem Relation Age of Onset  . Atrial fibrillation Mother   . Ovarian cancer Mother   . Heart disease Sister     Social History: Social History   Social History  . Marital Status: Married    Spouse Name: N/A  . Number of Children: N/A  . Years of  Education: N/A   Occupational History  . Not on file.   Social History Main Topics  . Smoking status: Never Smoker   . Smokeless tobacco: Never Used  . Alcohol Use: 4.2 oz/week    7 Standard drinks or equivalent per week  . Drug Use: No  . Sexual Activity: Not on file   Other Topics Concern  . Not on file   Social History Narrative    Electrocardiogram:    2015  SR rate 76  LAD PVC  QT 374   04/23/14  Same no change  05/04/15  SR arte 76 PVC otherwise normal QT 356   Assessment and Plan PVC;s: asymptomatic take mag-oxide with diuretic no structural heart disease HTN: Well controlled.  Continue current medications and low sodium Dash  type diet.  Normal BMET in January with Dr Harrington Challenger Low T: continue replacement Flu resolved normal exam no fevers   Jenkins Rouge

## 2015-05-04 ENCOUNTER — Encounter: Payer: Self-pay | Admitting: Cardiovascular Disease

## 2015-05-04 ENCOUNTER — Ambulatory Visit (INDEPENDENT_AMBULATORY_CARE_PROVIDER_SITE_OTHER): Payer: Managed Care, Other (non HMO) | Admitting: Cardiovascular Disease

## 2015-05-04 VITALS — BP 120/84 | HR 80 | Ht 74.0 in | Wt 207.8 lb

## 2015-05-04 DIAGNOSIS — I493 Ventricular premature depolarization: Secondary | ICD-10-CM

## 2015-05-04 DIAGNOSIS — I10 Essential (primary) hypertension: Secondary | ICD-10-CM

## 2015-05-04 NOTE — Patient Instructions (Addendum)

## 2016-05-09 NOTE — Progress Notes (Signed)
Patient ID: Alexander Rea., male   DOB: [birth year], 71 y.o.   MRN: [id number]  71 y.o.  previously seen by Dr Marlou Porch.  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 Recent increase in fatigue and exertional dyspnea.  No real chest pain but mild pressure when he does yard work and gets dyspnic.  Having issues with allergies now taking antihistamines  Chronic HTN on Rx White coat component Runs good at home  Denies excess NSAI's , ETOH  Compliant with meds.  Does not note PVCls no palpitations or syncope.  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  Works out pretty regularly with no chest pain but increasing fatigue and dyspnea last couple of months   F/U echo :  Reviewed  05/08/13  Normal  Study Conclusions  Left ventricle: The cavity size was normal. Systolic function was normal. The estimated ejection fraction was in the range of 60% to 65%. Wall motion was normal; there were no regional wall motion abnormalities.  ETT 05/08/13  Normal exercised 10 mintues   Diuretic stopped recently due to gout BP running higher at home Has issue with right heel ? Bursitis has had steroid injection but recurred And relafen not helping   ROS: Denies fever, malais, weight loss, blurry vision, decreased visual acuity, cough, sputum, SOB, hemoptysis, pleuritic pain, palpitaitons, heartburn, abdominal pain, melena, lower extremity edema, claudication, or rash.  All other systems reviewed and negative   General: Affect appropriate Healthy:  appears stated age 14: normal Neck supple with no adenopathy JVP normal no bruits no thyromegaly Lungs clear with no wheezing and good diaphragmatic motion Heart:  S1/S2 no murmur,rub, gallop or click PMI normal Abdomen: benighn, BS positve, no tenderness, no AAA no bruit.  No HSM or HJR Distal pulses intact with no bruits No edema Neuro non-focal Skin warm and dry No muscular weakness  Medications Current Outpatient  Prescriptions  Medication Sig Dispense Refill  . cholecalciferol (VITAMIN D) 1000 UNITS tablet Take 1,000 Units by mouth daily.    Marland Kitchen glucosamine-chondroitin 500-400 MG tablet Take 1 tablet by mouth daily.    Marland Kitchen L-LYSINE PO Take 1 tablet by mouth daily.     . milk thistle 175 MG tablet Take 175 mg by mouth daily.    . nabumetone (RELAFEN) 500 MG tablet Take 500 mg by mouth 2 (two) times daily.    . Omega-3 Fatty Acids (FISH OIL) 1000 MG CAPS Take 1 capsule by mouth daily.     . Omega-3 Fatty Acids (OMEGA 3 PO) Take 1 capsule by mouth daily. OMEGA XL    . valACYclovir (VALTREX) 1000 MG tablet Take 1,000 mg by mouth as directed.     . valsartan (DIOVAN) 320 MG tablet Take 320 mg by mouth daily.    . vitamin E 100 UNIT capsule Take 100 Units by mouth daily.      No current facility-administered medications for this visit.     Allergies Ciprofloxacin and Penicillins  Family History: Family History  Problem Relation Age of Onset  . Atrial fibrillation Mother   . Ovarian cancer Mother   . Heart disease Sister     Social History: Social History   Social History  . Marital status: Married    Spouse name: N/A  . Number of children: N/A  . Years of education: N/A   Occupational History  . Not on file.   Social History Main Topics  . Smoking status: Never Smoker  .  Smokeless tobacco: Never Used  . Alcohol use 4.2 oz/week    7 Standard drinks or equivalent per week  . Drug use: No  . Sexual activity: Not on file   Other Topics Concern  . Not on file   Social History Narrative  . No narrative on file    Electrocardiogram:    2015  SR rate 76  LAD PVC  QT 374   04/23/14  Same no change  05/04/15  SR arte 76 PVC otherwise normal QT 356  05/10/16 SR rate 79 normal ECG   Assessment and Plan PVC;s: asymptomatic take mag-oxide with diuretic no structural heart disease HTN: more elevated off diuretic consider adding norvasc if remains high will see Dr Harrington Challenger next week Low T: continue  replacement Bursitis:  Encouraged him to see foot doctor may need orthotic or oral trial of steroid Gout: avoid diuretic low red meet and less ETOH f/u Ross consider uloric    [employer]

## 2016-05-10 ENCOUNTER — Encounter: Payer: Self-pay | Admitting: Cardiovascular Disease

## 2016-05-10 ENCOUNTER — Other Ambulatory Visit: Payer: Self-pay

## 2016-05-10 ENCOUNTER — Encounter (INDEPENDENT_AMBULATORY_CARE_PROVIDER_SITE_OTHER): Payer: Self-pay

## 2016-05-10 ENCOUNTER — Ambulatory Visit (INDEPENDENT_AMBULATORY_CARE_PROVIDER_SITE_OTHER): Payer: 59 | Admitting: Cardiovascular Disease

## 2016-05-10 VITALS — BP 142/88 | HR 79 | Ht 74.0 in | Wt 211.8 lb

## 2016-05-10 DIAGNOSIS — I10 Essential (primary) hypertension: Secondary | ICD-10-CM

## 2016-05-10 NOTE — Patient Instructions (Addendum)

## 2017-02-06 DIAGNOSIS — S065XAA Traumatic subdural hemorrhage with loss of consciousness status unknown, initial encounter: Secondary | ICD-10-CM

## 2017-02-06 HISTORY — DX: Traumatic subdural hemorrhage with loss of consciousness status unknown, initial encounter: S06.5XAA

## 2017-03-05 DIAGNOSIS — M65332 Trigger finger, left middle finger: Secondary | ICD-10-CM | POA: Diagnosis not present

## 2017-03-30 DIAGNOSIS — I6200 Nontraumatic subdural hemorrhage, unspecified: Secondary | ICD-10-CM | POA: Diagnosis not present

## 2017-03-30 DIAGNOSIS — S065X0A Traumatic subdural hemorrhage without loss of consciousness, initial encounter: Secondary | ICD-10-CM | POA: Diagnosis not present

## 2017-03-30 DIAGNOSIS — R404 Transient alteration of awareness: Secondary | ICD-10-CM | POA: Diagnosis not present

## 2017-03-30 DIAGNOSIS — S8991XA Unspecified injury of right lower leg, initial encounter: Secondary | ICD-10-CM | POA: Diagnosis not present

## 2017-03-30 DIAGNOSIS — R402412 Glasgow coma scale score 13-15, at arrival to emergency department: Secondary | ICD-10-CM | POA: Diagnosis not present

## 2017-03-30 DIAGNOSIS — S065X9A Traumatic subdural hemorrhage with loss of consciousness of unspecified duration, initial encounter: Secondary | ICD-10-CM | POA: Diagnosis not present

## 2017-03-30 DIAGNOSIS — S8992XA Unspecified injury of left lower leg, initial encounter: Secondary | ICD-10-CM | POA: Diagnosis not present

## 2017-03-30 DIAGNOSIS — T07XXXA Unspecified multiple injuries, initial encounter: Secondary | ICD-10-CM | POA: Diagnosis not present

## 2017-03-30 DIAGNOSIS — S0083XA Contusion of other part of head, initial encounter: Secondary | ICD-10-CM | POA: Diagnosis not present

## 2017-03-30 DIAGNOSIS — M19011 Primary osteoarthritis, right shoulder: Secondary | ICD-10-CM | POA: Diagnosis not present

## 2017-03-30 DIAGNOSIS — S46002A Unspecified injury of muscle(s) and tendon(s) of the rotator cuff of left shoulder, initial encounter: Secondary | ICD-10-CM | POA: Diagnosis not present

## 2017-03-30 DIAGNOSIS — S060X1A Concussion with loss of consciousness of 30 minutes or less, initial encounter: Secondary | ICD-10-CM | POA: Diagnosis not present

## 2017-03-30 DIAGNOSIS — Z743 Need for continuous supervision: Secondary | ICD-10-CM | POA: Diagnosis not present

## 2017-03-30 DIAGNOSIS — I10 Essential (primary) hypertension: Secondary | ICD-10-CM | POA: Diagnosis not present

## 2017-03-30 DIAGNOSIS — S4992XA Unspecified injury of left shoulder and upper arm, initial encounter: Secondary | ICD-10-CM | POA: Diagnosis not present

## 2017-03-30 DIAGNOSIS — W500XXA Accidental hit or strike by another person, initial encounter: Secondary | ICD-10-CM | POA: Diagnosis not present

## 2017-03-30 DIAGNOSIS — S0993XA Unspecified injury of face, initial encounter: Secondary | ICD-10-CM | POA: Diagnosis not present

## 2017-03-30 DIAGNOSIS — S0990XA Unspecified injury of head, initial encounter: Secondary | ICD-10-CM | POA: Diagnosis not present

## 2017-03-30 DIAGNOSIS — Y9323 Activity, snow (alpine) (downhill) skiing, snow boarding, sledding, tobogganing and snow tubing: Secondary | ICD-10-CM | POA: Diagnosis not present

## 2017-03-30 DIAGNOSIS — S40019A Contusion of unspecified shoulder, initial encounter: Secondary | ICD-10-CM | POA: Diagnosis present

## 2017-03-30 DIAGNOSIS — M25512 Pain in left shoulder: Secondary | ICD-10-CM | POA: Diagnosis not present

## 2017-03-30 DIAGNOSIS — G8911 Acute pain due to trauma: Secondary | ICD-10-CM | POA: Diagnosis not present

## 2017-03-30 DIAGNOSIS — Y92838 Other recreation area as the place of occurrence of the external cause: Secondary | ICD-10-CM | POA: Diagnosis not present

## 2017-04-02 DIAGNOSIS — M25511 Pain in right shoulder: Secondary | ICD-10-CM | POA: Diagnosis not present

## 2017-04-02 DIAGNOSIS — M25512 Pain in left shoulder: Secondary | ICD-10-CM | POA: Diagnosis not present

## 2017-04-05 DIAGNOSIS — M25512 Pain in left shoulder: Secondary | ICD-10-CM | POA: Diagnosis not present

## 2017-04-05 DIAGNOSIS — M25511 Pain in right shoulder: Secondary | ICD-10-CM | POA: Diagnosis not present

## 2017-04-09 ENCOUNTER — Other Ambulatory Visit: Payer: Self-pay | Admitting: Orthopaedic Surgery

## 2017-04-09 ENCOUNTER — Ambulatory Visit
Admission: RE | Admit: 2017-04-09 | Discharge: 2017-04-09 | Disposition: A | Discharge status: Home / Self Care | Payer: Self-pay | Source: Ambulatory Visit | Attending: Orthopaedic Surgery | Admitting: Orthopaedic Surgery

## 2017-04-09 ENCOUNTER — Ambulatory Visit
Admission: RE | Admit: 2017-04-09 | Discharge: 2017-04-09 | Disposition: A | Discharge status: Home / Self Care | Payer: Medicare Other | Source: Ambulatory Visit | Attending: Orthopaedic Surgery | Admitting: Orthopaedic Surgery

## 2017-04-09 DIAGNOSIS — S065X9A Traumatic subdural hemorrhage with loss of consciousness of unspecified duration, initial encounter: Secondary | ICD-10-CM

## 2017-04-09 DIAGNOSIS — S065X0A Traumatic subdural hemorrhage without loss of consciousness, initial encounter: Secondary | ICD-10-CM | POA: Diagnosis not present

## 2017-04-09 DIAGNOSIS — S065XAA Traumatic subdural hemorrhage with loss of consciousness status unknown, initial encounter: Secondary | ICD-10-CM

## 2017-04-09 DIAGNOSIS — M25511 Pain in right shoulder: Secondary | ICD-10-CM | POA: Diagnosis not present

## 2017-04-12 DIAGNOSIS — S065X9A Traumatic subdural hemorrhage with loss of consciousness of unspecified duration, initial encounter: Secondary | ICD-10-CM | POA: Diagnosis not present

## 2017-04-13 ENCOUNTER — Other Ambulatory Visit: Payer: Self-pay | Admitting: Neurological Surgery

## 2017-04-13 DIAGNOSIS — S065XAA Traumatic subdural hemorrhage with loss of consciousness status unknown, initial encounter: Secondary | ICD-10-CM

## 2017-04-13 DIAGNOSIS — S065X9A Traumatic subdural hemorrhage with loss of consciousness of unspecified duration, initial encounter: Secondary | ICD-10-CM

## 2017-04-17 DIAGNOSIS — R51 Headache: Secondary | ICD-10-CM | POA: Diagnosis not present

## 2017-04-17 DIAGNOSIS — I6200 Nontraumatic subdural hemorrhage, unspecified: Secondary | ICD-10-CM | POA: Diagnosis not present

## 2017-04-17 DIAGNOSIS — R634 Abnormal weight loss: Secondary | ICD-10-CM | POA: Diagnosis not present

## 2017-04-30 ENCOUNTER — Ambulatory Visit
Admission: RE | Admit: 2017-04-30 | Discharge: 2017-04-30 | Disposition: A | Discharge status: Home / Self Care | Payer: Medicare Other | Source: Ambulatory Visit | Attending: Neurological Surgery | Admitting: Neurological Surgery

## 2017-04-30 DIAGNOSIS — S065XAA Traumatic subdural hemorrhage with loss of consciousness status unknown, initial encounter: Secondary | ICD-10-CM

## 2017-04-30 DIAGNOSIS — S065X9A Traumatic subdural hemorrhage with loss of consciousness of unspecified duration, initial encounter: Secondary | ICD-10-CM

## 2017-04-30 DIAGNOSIS — I6200 Nontraumatic subdural hemorrhage, unspecified: Secondary | ICD-10-CM | POA: Diagnosis not present

## 2017-05-01 DIAGNOSIS — E291 Testicular hypofunction: Secondary | ICD-10-CM | POA: Diagnosis not present

## 2017-05-01 DIAGNOSIS — Z1159 Encounter for screening for other viral diseases: Secondary | ICD-10-CM | POA: Diagnosis not present

## 2017-05-01 DIAGNOSIS — I10 Essential (primary) hypertension: Secondary | ICD-10-CM | POA: Diagnosis not present

## 2017-05-01 DIAGNOSIS — Z125 Encounter for screening for malignant neoplasm of prostate: Secondary | ICD-10-CM | POA: Diagnosis not present

## 2017-05-01 DIAGNOSIS — Z79899 Other long term (current) drug therapy: Secondary | ICD-10-CM | POA: Diagnosis not present

## 2017-05-01 DIAGNOSIS — M109 Gout, unspecified: Secondary | ICD-10-CM | POA: Diagnosis not present

## 2017-05-01 DIAGNOSIS — E7800 Pure hypercholesterolemia, unspecified: Secondary | ICD-10-CM | POA: Diagnosis not present

## 2017-05-01 DIAGNOSIS — Z0000 Encounter for general adult medical examination without abnormal findings: Secondary | ICD-10-CM | POA: Diagnosis not present

## 2017-05-09 DIAGNOSIS — M25512 Pain in left shoulder: Secondary | ICD-10-CM | POA: Diagnosis not present

## 2017-05-11 ENCOUNTER — Telehealth: Payer: Self-pay | Admitting: Cardiovascular Disease

## 2017-05-11 DIAGNOSIS — H35412 Lattice degeneration of retina, left eye: Secondary | ICD-10-CM | POA: Diagnosis not present

## 2017-05-11 DIAGNOSIS — H33012 Retinal detachment with single break, left eye: Secondary | ICD-10-CM | POA: Diagnosis not present

## 2017-05-11 DIAGNOSIS — H43812 Vitreous degeneration, left eye: Secondary | ICD-10-CM | POA: Diagnosis not present

## 2017-05-11 NOTE — Telephone Encounter (Signed)
New Message   Pt calling stating that he is having shoulder surgery on his rotter cuff and  Bicep, will be taking pain meds and wants to know if it will affect his ekg he has on every ov. Please call

## 2017-05-11 NOTE — Telephone Encounter (Signed)
Informed patient that it will be fine for him to take his pain medications when he comes and sees [place] for his yearly visit. Patient verbalized understanding.

## 2017-05-17 DIAGNOSIS — S46012A Strain of muscle(s) and tendon(s) of the rotator cuff of left shoulder, initial encounter: Secondary | ICD-10-CM | POA: Diagnosis not present

## 2017-05-17 DIAGNOSIS — Y9323 Activity, snow (alpine) (downhill) skiing, snow boarding, sledding, tobogganing and snow tubing: Secondary | ICD-10-CM | POA: Diagnosis not present

## 2017-05-17 DIAGNOSIS — G8918 Other acute postprocedural pain: Secondary | ICD-10-CM | POA: Diagnosis not present

## 2017-05-17 DIAGNOSIS — M7542 Impingement syndrome of left shoulder: Secondary | ICD-10-CM | POA: Diagnosis not present

## 2017-05-17 DIAGNOSIS — S46192A Other injury of muscle, fascia and tendon of long head of biceps, left arm, initial encounter: Secondary | ICD-10-CM | POA: Diagnosis not present

## 2017-05-17 DIAGNOSIS — S46112A Strain of muscle, fascia and tendon of long head of biceps, left arm, initial encounter: Secondary | ICD-10-CM | POA: Diagnosis not present

## 2017-05-28 ENCOUNTER — Ambulatory Visit: Payer: Medicare Other | Admitting: Cardiovascular Disease

## 2017-05-28 DIAGNOSIS — M25511 Pain in right shoulder: Secondary | ICD-10-CM | POA: Diagnosis not present

## 2017-05-29 DIAGNOSIS — H33012 Retinal detachment with single break, left eye: Secondary | ICD-10-CM | POA: Diagnosis not present

## 2017-06-18 DIAGNOSIS — Z9889 Other specified postprocedural states: Secondary | ICD-10-CM | POA: Diagnosis not present

## 2017-06-19 DIAGNOSIS — M75121 Complete rotator cuff tear or rupture of right shoulder, not specified as traumatic: Secondary | ICD-10-CM | POA: Diagnosis not present

## 2017-06-19 DIAGNOSIS — M75122 Complete rotator cuff tear or rupture of left shoulder, not specified as traumatic: Secondary | ICD-10-CM | POA: Diagnosis not present

## 2017-06-19 DIAGNOSIS — M25512 Pain in left shoulder: Secondary | ICD-10-CM | POA: Diagnosis not present

## 2017-06-19 DIAGNOSIS — M25511 Pain in right shoulder: Secondary | ICD-10-CM | POA: Diagnosis not present

## 2017-06-25 DIAGNOSIS — M25512 Pain in left shoulder: Secondary | ICD-10-CM | POA: Diagnosis not present

## 2017-06-25 DIAGNOSIS — M25511 Pain in right shoulder: Secondary | ICD-10-CM | POA: Diagnosis not present

## 2017-06-25 DIAGNOSIS — M75121 Complete rotator cuff tear or rupture of right shoulder, not specified as traumatic: Secondary | ICD-10-CM | POA: Diagnosis not present

## 2017-06-25 DIAGNOSIS — M75122 Complete rotator cuff tear or rupture of left shoulder, not specified as traumatic: Secondary | ICD-10-CM | POA: Diagnosis not present

## 2017-06-27 DIAGNOSIS — M75121 Complete rotator cuff tear or rupture of right shoulder, not specified as traumatic: Secondary | ICD-10-CM | POA: Diagnosis not present

## 2017-06-27 DIAGNOSIS — M75122 Complete rotator cuff tear or rupture of left shoulder, not specified as traumatic: Secondary | ICD-10-CM | POA: Diagnosis not present

## 2017-06-27 DIAGNOSIS — M25511 Pain in right shoulder: Secondary | ICD-10-CM | POA: Diagnosis not present

## 2017-06-27 DIAGNOSIS — M25512 Pain in left shoulder: Secondary | ICD-10-CM | POA: Diagnosis not present

## 2017-06-29 DIAGNOSIS — M25511 Pain in right shoulder: Secondary | ICD-10-CM | POA: Diagnosis not present

## 2017-06-29 DIAGNOSIS — M75122 Complete rotator cuff tear or rupture of left shoulder, not specified as traumatic: Secondary | ICD-10-CM | POA: Diagnosis not present

## 2017-06-29 DIAGNOSIS — M25512 Pain in left shoulder: Secondary | ICD-10-CM | POA: Diagnosis not present

## 2017-06-29 DIAGNOSIS — M75121 Complete rotator cuff tear or rupture of right shoulder, not specified as traumatic: Secondary | ICD-10-CM | POA: Diagnosis not present

## 2017-07-03 DIAGNOSIS — M25511 Pain in right shoulder: Secondary | ICD-10-CM | POA: Diagnosis not present

## 2017-07-03 DIAGNOSIS — M75121 Complete rotator cuff tear or rupture of right shoulder, not specified as traumatic: Secondary | ICD-10-CM | POA: Diagnosis not present

## 2017-07-03 DIAGNOSIS — M25512 Pain in left shoulder: Secondary | ICD-10-CM | POA: Diagnosis not present

## 2017-07-03 DIAGNOSIS — M75122 Complete rotator cuff tear or rupture of left shoulder, not specified as traumatic: Secondary | ICD-10-CM | POA: Diagnosis not present

## 2017-07-05 DIAGNOSIS — M25512 Pain in left shoulder: Secondary | ICD-10-CM | POA: Diagnosis not present

## 2017-07-05 DIAGNOSIS — M25511 Pain in right shoulder: Secondary | ICD-10-CM | POA: Diagnosis not present

## 2017-07-05 DIAGNOSIS — M75122 Complete rotator cuff tear or rupture of left shoulder, not specified as traumatic: Secondary | ICD-10-CM | POA: Diagnosis not present

## 2017-07-05 DIAGNOSIS — M75121 Complete rotator cuff tear or rupture of right shoulder, not specified as traumatic: Secondary | ICD-10-CM | POA: Diagnosis not present

## 2017-07-09 DIAGNOSIS — M25511 Pain in right shoulder: Secondary | ICD-10-CM | POA: Diagnosis not present

## 2017-07-09 DIAGNOSIS — M75121 Complete rotator cuff tear or rupture of right shoulder, not specified as traumatic: Secondary | ICD-10-CM | POA: Diagnosis not present

## 2017-07-09 DIAGNOSIS — M75122 Complete rotator cuff tear or rupture of left shoulder, not specified as traumatic: Secondary | ICD-10-CM | POA: Diagnosis not present

## 2017-07-09 DIAGNOSIS — M25512 Pain in left shoulder: Secondary | ICD-10-CM | POA: Diagnosis not present

## 2017-07-10 NOTE — Progress Notes (Signed)
Patient ID: Alexander Rea., male   DOB: 1945/06/23, 72 y.o.   MRN: [id number]  72 y.o.  previously seen by Dr Marlou Porch.  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 Recent increase in fatigue and exertional dyspnea.  No real chest pain but mild pressure when he does yard work and gets dyspnic.  Having issues with allergies now taking antihistamines  Chronic HTN on Rx White coat component Runs good at home  Denies excess NSAI's , ETOH  Compliant with meds.  Does not note PVCls no palpitations or syncope.  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  Works out pretty regularly with no chest pain but increasing fatigue and dyspnea last couple of months   F/U echo :  Reviewed  05/08/13  Normal  Study Conclusions  Left ventricle: The cavity size was normal. Systolic function was normal. The estimated ejection fraction was in the range of 60% to 65%. Wall motion was normal; there were no regional wall motion abnormalities.  ETT 05/08/13  Normal exercised 10 mintues   Diuretic stopped recently due to gout BP running higher at home Has issue with right heel ? Bursitis has had steroid injection but recurred And relafen not helping   Had a subdural hematoma during skiing accident in March 2019 with improvement And no mass effect on last CT done 04/30/17  ROS: Denies fever, malais, weight loss, blurry vision, decreased visual acuity, cough, sputum, SOB, hemoptysis, pleuritic pain, palpitaitons, heartburn, abdominal pain, melena, lower extremity edema, claudication, or rash.  All other systems reviewed and negative   General: Affect appropriate Healthy:  appears stated age 72: normal Neck supple with no adenopathy JVP normal no bruits no thyromegaly Lungs clear with no wheezing and good diaphragmatic motion Heart:  S1/S2 no murmur,rub, gallop or click PMI normal Abdomen: benighn, BS positve, no tenderness, no AAA no bruit.  No HSM or HJR Distal pulses intact  with no bruits No edema Neuro non-focal Skin warm and dry No muscular weakness  Medications Current Outpatient Medications  Medication Sig Dispense Refill  . cholecalciferol (VITAMIN D) 1000 UNITS tablet Take 1,000 Units by mouth daily.    Marland Kitchen glucosamine-chondroitin 500-400 MG tablet Take 1 tablet by mouth daily.    Marland Kitchen L-LYSINE PO Take 1 tablet by mouth daily.     . milk thistle 175 MG tablet Take 175 mg by mouth daily.    . nabumetone (RELAFEN) 500 MG tablet Take 500 mg by mouth 2 (two) times daily.    . Omega-3 Fatty Acids (FISH OIL) 1000 MG CAPS Take 1 capsule by mouth daily.     Marland Kitchen testosterone (ANDROGEL) 50 MG/5GM (1%) GEL Apply topically as directed.    . valACYclovir (VALTREX) 1000 MG tablet Take 1,000 mg by mouth as directed.     . valsartan (DIOVAN) 320 MG tablet Take 320 mg by mouth daily.    . vitamin E 100 UNIT capsule Take 100 Units by mouth daily.      No current facility-administered medications for this visit.     Allergies Ciprofloxacin and Penicillins  Family History: Family History  Problem Relation Age of Onset  . Atrial fibrillation Mother   . Ovarian cancer Mother   . Heart disease Sister     Social History: Social History   Socioeconomic History  . Marital status: Married    Spouse name: Not on file  . Number of children: Not on file  . Years of education: Not on  file  . Highest education level: Not on file  Occupational History  . Not on file  Social Needs  . Financial resource strain: Not on file  . Food insecurity:    Worry: Not on file    Inability: Not on file  . Transportation needs:    Medical: Not on file    Non-medical: Not on file  Tobacco Use  . Smoking status: Never Smoker  . Smokeless tobacco: Never Used  Substance and Sexual Activity  . Alcohol use: Yes    Alcohol/week: 4.2 oz    Types: 7 Standard drinks or equivalent per week  . Drug use: No  . Sexual activity: Not on file  Lifestyle  . Physical activity:    Days per  week: Not on file    Minutes per session: Not on file  . Stress: Not on file  Relationships  . Social connections:    Talks on phone: Not on file    Gets together: Not on file    Attends religious service: Not on file    Active member of club or organization: Not on file    Attends meetings of clubs or organizations: Not on file    Relationship status: Not on file  . Intimate partner violence:    Fear of current or ex partner: Not on file    Emotionally abused: Not on file    Physically abused: Not on file    Forced sexual activity: Not on file  Other Topics Concern  . Not on file  Social History Narrative  . Not on file    Electrocardiogram:    07/11/17 SR rate 83 normal   Assessment and Plan PVC;s: asymptomatic take mag-oxide with diuretic no structural heart disease HTN: more elevated off diuretic consider adding norvasc if remains high will see Dr Harrington Challenger next week Low T: continue replacement Gout: avoid diuretic low red meet and less ETOH f/u Ross consider uloric  Subdural Hematoma: traumatic improved on latest CT f/u neuro   Jenkins Rouge

## 2017-07-11 ENCOUNTER — Ambulatory Visit (INDEPENDENT_AMBULATORY_CARE_PROVIDER_SITE_OTHER): Payer: Medicare Other | Admitting: Cardiovascular Disease

## 2017-07-11 ENCOUNTER — Encounter: Payer: Self-pay | Admitting: Cardiovascular Disease

## 2017-07-11 VITALS — BP 124/80 | HR 95 | Ht 74.0 in | Wt 195.0 lb

## 2017-07-11 DIAGNOSIS — I10 Essential (primary) hypertension: Secondary | ICD-10-CM | POA: Diagnosis not present

## 2017-07-11 DIAGNOSIS — M75121 Complete rotator cuff tear or rupture of right shoulder, not specified as traumatic: Secondary | ICD-10-CM | POA: Diagnosis not present

## 2017-07-11 DIAGNOSIS — M75122 Complete rotator cuff tear or rupture of left shoulder, not specified as traumatic: Secondary | ICD-10-CM | POA: Diagnosis not present

## 2017-07-11 DIAGNOSIS — M25511 Pain in right shoulder: Secondary | ICD-10-CM | POA: Diagnosis not present

## 2017-07-11 DIAGNOSIS — M25512 Pain in left shoulder: Secondary | ICD-10-CM | POA: Diagnosis not present

## 2017-07-11 NOTE — Patient Instructions (Signed)

## 2017-07-13 DIAGNOSIS — M25511 Pain in right shoulder: Secondary | ICD-10-CM | POA: Diagnosis not present

## 2017-07-13 DIAGNOSIS — M75121 Complete rotator cuff tear or rupture of right shoulder, not specified as traumatic: Secondary | ICD-10-CM | POA: Diagnosis not present

## 2017-07-13 DIAGNOSIS — M25512 Pain in left shoulder: Secondary | ICD-10-CM | POA: Diagnosis not present

## 2017-07-13 DIAGNOSIS — M75122 Complete rotator cuff tear or rupture of left shoulder, not specified as traumatic: Secondary | ICD-10-CM | POA: Diagnosis not present

## 2017-07-16 DIAGNOSIS — M25511 Pain in right shoulder: Secondary | ICD-10-CM | POA: Diagnosis not present

## 2017-07-16 DIAGNOSIS — M75121 Complete rotator cuff tear or rupture of right shoulder, not specified as traumatic: Secondary | ICD-10-CM | POA: Diagnosis not present

## 2017-07-16 DIAGNOSIS — M75122 Complete rotator cuff tear or rupture of left shoulder, not specified as traumatic: Secondary | ICD-10-CM | POA: Diagnosis not present

## 2017-07-16 DIAGNOSIS — M25512 Pain in left shoulder: Secondary | ICD-10-CM | POA: Diagnosis not present

## 2017-07-20 DIAGNOSIS — M25511 Pain in right shoulder: Secondary | ICD-10-CM | POA: Diagnosis not present

## 2017-07-20 DIAGNOSIS — M25512 Pain in left shoulder: Secondary | ICD-10-CM | POA: Diagnosis not present

## 2017-07-20 DIAGNOSIS — M75121 Complete rotator cuff tear or rupture of right shoulder, not specified as traumatic: Secondary | ICD-10-CM | POA: Diagnosis not present

## 2017-07-20 DIAGNOSIS — M75122 Complete rotator cuff tear or rupture of left shoulder, not specified as traumatic: Secondary | ICD-10-CM | POA: Diagnosis not present

## 2017-07-24 DIAGNOSIS — M75122 Complete rotator cuff tear or rupture of left shoulder, not specified as traumatic: Secondary | ICD-10-CM | POA: Diagnosis not present

## 2017-07-24 DIAGNOSIS — M75121 Complete rotator cuff tear or rupture of right shoulder, not specified as traumatic: Secondary | ICD-10-CM | POA: Diagnosis not present

## 2017-07-24 DIAGNOSIS — M25511 Pain in right shoulder: Secondary | ICD-10-CM | POA: Diagnosis not present

## 2017-07-24 DIAGNOSIS — M25512 Pain in left shoulder: Secondary | ICD-10-CM | POA: Diagnosis not present

## 2017-07-26 DIAGNOSIS — M25511 Pain in right shoulder: Secondary | ICD-10-CM | POA: Diagnosis not present

## 2017-07-26 DIAGNOSIS — M75122 Complete rotator cuff tear or rupture of left shoulder, not specified as traumatic: Secondary | ICD-10-CM | POA: Diagnosis not present

## 2017-07-26 DIAGNOSIS — M75121 Complete rotator cuff tear or rupture of right shoulder, not specified as traumatic: Secondary | ICD-10-CM | POA: Diagnosis not present

## 2017-07-26 DIAGNOSIS — M25512 Pain in left shoulder: Secondary | ICD-10-CM | POA: Diagnosis not present

## 2017-08-01 DIAGNOSIS — M25512 Pain in left shoulder: Secondary | ICD-10-CM | POA: Diagnosis not present

## 2017-08-01 DIAGNOSIS — M75121 Complete rotator cuff tear or rupture of right shoulder, not specified as traumatic: Secondary | ICD-10-CM | POA: Diagnosis not present

## 2017-08-01 DIAGNOSIS — M25511 Pain in right shoulder: Secondary | ICD-10-CM | POA: Diagnosis not present

## 2017-08-01 DIAGNOSIS — M75122 Complete rotator cuff tear or rupture of left shoulder, not specified as traumatic: Secondary | ICD-10-CM | POA: Diagnosis not present

## 2017-08-06 DIAGNOSIS — M25512 Pain in left shoulder: Secondary | ICD-10-CM | POA: Diagnosis not present

## 2017-08-06 DIAGNOSIS — M25511 Pain in right shoulder: Secondary | ICD-10-CM | POA: Diagnosis not present

## 2017-08-06 DIAGNOSIS — M75121 Complete rotator cuff tear or rupture of right shoulder, not specified as traumatic: Secondary | ICD-10-CM | POA: Diagnosis not present

## 2017-08-06 DIAGNOSIS — M75122 Complete rotator cuff tear or rupture of left shoulder, not specified as traumatic: Secondary | ICD-10-CM | POA: Diagnosis not present

## 2017-08-08 DIAGNOSIS — M75121 Complete rotator cuff tear or rupture of right shoulder, not specified as traumatic: Secondary | ICD-10-CM | POA: Diagnosis not present

## 2017-08-08 DIAGNOSIS — M25512 Pain in left shoulder: Secondary | ICD-10-CM | POA: Diagnosis not present

## 2017-08-08 DIAGNOSIS — M75122 Complete rotator cuff tear or rupture of left shoulder, not specified as traumatic: Secondary | ICD-10-CM | POA: Diagnosis not present

## 2017-08-08 DIAGNOSIS — M25511 Pain in right shoulder: Secondary | ICD-10-CM | POA: Diagnosis not present

## 2017-08-10 DIAGNOSIS — M25512 Pain in left shoulder: Secondary | ICD-10-CM | POA: Diagnosis not present

## 2017-08-10 DIAGNOSIS — M75122 Complete rotator cuff tear or rupture of left shoulder, not specified as traumatic: Secondary | ICD-10-CM | POA: Diagnosis not present

## 2017-08-10 DIAGNOSIS — M75121 Complete rotator cuff tear or rupture of right shoulder, not specified as traumatic: Secondary | ICD-10-CM | POA: Diagnosis not present

## 2017-08-10 DIAGNOSIS — M25511 Pain in right shoulder: Secondary | ICD-10-CM | POA: Diagnosis not present

## 2017-08-11 DIAGNOSIS — J029 Acute pharyngitis, unspecified: Secondary | ICD-10-CM | POA: Diagnosis not present

## 2017-08-13 DIAGNOSIS — M75121 Complete rotator cuff tear or rupture of right shoulder, not specified as traumatic: Secondary | ICD-10-CM | POA: Diagnosis not present

## 2017-08-13 DIAGNOSIS — M25511 Pain in right shoulder: Secondary | ICD-10-CM | POA: Diagnosis not present

## 2017-08-13 DIAGNOSIS — M25512 Pain in left shoulder: Secondary | ICD-10-CM | POA: Diagnosis not present

## 2017-08-13 DIAGNOSIS — M75122 Complete rotator cuff tear or rupture of left shoulder, not specified as traumatic: Secondary | ICD-10-CM | POA: Diagnosis not present

## 2017-08-13 DIAGNOSIS — Z9889 Other specified postprocedural states: Secondary | ICD-10-CM | POA: Diagnosis not present

## 2017-08-14 DIAGNOSIS — E291 Testicular hypofunction: Secondary | ICD-10-CM | POA: Diagnosis not present

## 2017-08-15 DIAGNOSIS — M75122 Complete rotator cuff tear or rupture of left shoulder, not specified as traumatic: Secondary | ICD-10-CM | POA: Diagnosis not present

## 2017-08-15 DIAGNOSIS — M25512 Pain in left shoulder: Secondary | ICD-10-CM | POA: Diagnosis not present

## 2017-08-15 DIAGNOSIS — M75121 Complete rotator cuff tear or rupture of right shoulder, not specified as traumatic: Secondary | ICD-10-CM | POA: Diagnosis not present

## 2017-08-15 DIAGNOSIS — M25511 Pain in right shoulder: Secondary | ICD-10-CM | POA: Diagnosis not present

## 2017-08-17 DIAGNOSIS — M25512 Pain in left shoulder: Secondary | ICD-10-CM | POA: Diagnosis not present

## 2017-08-17 DIAGNOSIS — M75122 Complete rotator cuff tear or rupture of left shoulder, not specified as traumatic: Secondary | ICD-10-CM | POA: Diagnosis not present

## 2017-08-17 DIAGNOSIS — M25511 Pain in right shoulder: Secondary | ICD-10-CM | POA: Diagnosis not present

## 2017-08-17 DIAGNOSIS — M75121 Complete rotator cuff tear or rupture of right shoulder, not specified as traumatic: Secondary | ICD-10-CM | POA: Diagnosis not present

## 2017-08-20 DIAGNOSIS — M75122 Complete rotator cuff tear or rupture of left shoulder, not specified as traumatic: Secondary | ICD-10-CM | POA: Diagnosis not present

## 2017-08-20 DIAGNOSIS — M75121 Complete rotator cuff tear or rupture of right shoulder, not specified as traumatic: Secondary | ICD-10-CM | POA: Diagnosis not present

## 2017-08-20 DIAGNOSIS — M25512 Pain in left shoulder: Secondary | ICD-10-CM | POA: Diagnosis not present

## 2017-08-20 DIAGNOSIS — M25511 Pain in right shoulder: Secondary | ICD-10-CM | POA: Diagnosis not present

## 2017-08-21 DIAGNOSIS — M75121 Complete rotator cuff tear or rupture of right shoulder, not specified as traumatic: Secondary | ICD-10-CM | POA: Diagnosis not present

## 2017-08-21 DIAGNOSIS — M25511 Pain in right shoulder: Secondary | ICD-10-CM | POA: Diagnosis not present

## 2017-08-21 DIAGNOSIS — M25512 Pain in left shoulder: Secondary | ICD-10-CM | POA: Diagnosis not present

## 2017-08-21 DIAGNOSIS — M75122 Complete rotator cuff tear or rupture of left shoulder, not specified as traumatic: Secondary | ICD-10-CM | POA: Diagnosis not present

## 2017-08-30 DIAGNOSIS — M75122 Complete rotator cuff tear or rupture of left shoulder, not specified as traumatic: Secondary | ICD-10-CM | POA: Diagnosis not present

## 2017-08-30 DIAGNOSIS — M25511 Pain in right shoulder: Secondary | ICD-10-CM | POA: Diagnosis not present

## 2017-08-30 DIAGNOSIS — M25512 Pain in left shoulder: Secondary | ICD-10-CM | POA: Diagnosis not present

## 2017-08-30 DIAGNOSIS — M75121 Complete rotator cuff tear or rupture of right shoulder, not specified as traumatic: Secondary | ICD-10-CM | POA: Diagnosis not present

## 2017-08-30 DIAGNOSIS — E291 Testicular hypofunction: Secondary | ICD-10-CM | POA: Diagnosis not present

## 2017-08-31 DIAGNOSIS — M25511 Pain in right shoulder: Secondary | ICD-10-CM | POA: Diagnosis not present

## 2017-08-31 DIAGNOSIS — M25512 Pain in left shoulder: Secondary | ICD-10-CM | POA: Diagnosis not present

## 2017-08-31 DIAGNOSIS — M75121 Complete rotator cuff tear or rupture of right shoulder, not specified as traumatic: Secondary | ICD-10-CM | POA: Diagnosis not present

## 2017-08-31 DIAGNOSIS — M75122 Complete rotator cuff tear or rupture of left shoulder, not specified as traumatic: Secondary | ICD-10-CM | POA: Diagnosis not present

## 2017-09-07 DIAGNOSIS — M75122 Complete rotator cuff tear or rupture of left shoulder, not specified as traumatic: Secondary | ICD-10-CM | POA: Diagnosis not present

## 2017-09-07 DIAGNOSIS — M25512 Pain in left shoulder: Secondary | ICD-10-CM | POA: Diagnosis not present

## 2017-09-07 DIAGNOSIS — M25511 Pain in right shoulder: Secondary | ICD-10-CM | POA: Diagnosis not present

## 2017-09-07 DIAGNOSIS — M75121 Complete rotator cuff tear or rupture of right shoulder, not specified as traumatic: Secondary | ICD-10-CM | POA: Diagnosis not present

## 2017-09-10 DIAGNOSIS — M75121 Complete rotator cuff tear or rupture of right shoulder, not specified as traumatic: Secondary | ICD-10-CM | POA: Diagnosis not present

## 2017-09-10 DIAGNOSIS — M25512 Pain in left shoulder: Secondary | ICD-10-CM | POA: Diagnosis not present

## 2017-09-10 DIAGNOSIS — M75122 Complete rotator cuff tear or rupture of left shoulder, not specified as traumatic: Secondary | ICD-10-CM | POA: Diagnosis not present

## 2017-09-10 DIAGNOSIS — M25511 Pain in right shoulder: Secondary | ICD-10-CM | POA: Diagnosis not present

## 2017-09-12 DIAGNOSIS — M75121 Complete rotator cuff tear or rupture of right shoulder, not specified as traumatic: Secondary | ICD-10-CM | POA: Diagnosis not present

## 2017-09-12 DIAGNOSIS — M25511 Pain in right shoulder: Secondary | ICD-10-CM | POA: Diagnosis not present

## 2017-09-12 DIAGNOSIS — M75122 Complete rotator cuff tear or rupture of left shoulder, not specified as traumatic: Secondary | ICD-10-CM | POA: Diagnosis not present

## 2017-09-12 DIAGNOSIS — M25512 Pain in left shoulder: Secondary | ICD-10-CM | POA: Diagnosis not present

## 2017-09-14 DIAGNOSIS — M25511 Pain in right shoulder: Secondary | ICD-10-CM | POA: Diagnosis not present

## 2017-09-14 DIAGNOSIS — M75121 Complete rotator cuff tear or rupture of right shoulder, not specified as traumatic: Secondary | ICD-10-CM | POA: Diagnosis not present

## 2017-09-14 DIAGNOSIS — M25512 Pain in left shoulder: Secondary | ICD-10-CM | POA: Diagnosis not present

## 2017-09-14 DIAGNOSIS — M75122 Complete rotator cuff tear or rupture of left shoulder, not specified as traumatic: Secondary | ICD-10-CM | POA: Diagnosis not present

## 2017-09-17 DIAGNOSIS — E291 Testicular hypofunction: Secondary | ICD-10-CM | POA: Diagnosis not present

## 2017-09-17 DIAGNOSIS — M25511 Pain in right shoulder: Secondary | ICD-10-CM | POA: Diagnosis not present

## 2017-09-17 DIAGNOSIS — M75121 Complete rotator cuff tear or rupture of right shoulder, not specified as traumatic: Secondary | ICD-10-CM | POA: Diagnosis not present

## 2017-09-17 DIAGNOSIS — M75122 Complete rotator cuff tear or rupture of left shoulder, not specified as traumatic: Secondary | ICD-10-CM | POA: Diagnosis not present

## 2017-09-17 DIAGNOSIS — M25512 Pain in left shoulder: Secondary | ICD-10-CM | POA: Diagnosis not present

## 2017-09-19 DIAGNOSIS — M25512 Pain in left shoulder: Secondary | ICD-10-CM | POA: Diagnosis not present

## 2017-09-19 DIAGNOSIS — M75122 Complete rotator cuff tear or rupture of left shoulder, not specified as traumatic: Secondary | ICD-10-CM | POA: Diagnosis not present

## 2017-09-19 DIAGNOSIS — M75121 Complete rotator cuff tear or rupture of right shoulder, not specified as traumatic: Secondary | ICD-10-CM | POA: Diagnosis not present

## 2017-09-19 DIAGNOSIS — M25511 Pain in right shoulder: Secondary | ICD-10-CM | POA: Diagnosis not present

## 2017-09-21 DIAGNOSIS — M25512 Pain in left shoulder: Secondary | ICD-10-CM | POA: Diagnosis not present

## 2017-09-21 DIAGNOSIS — M75121 Complete rotator cuff tear or rupture of right shoulder, not specified as traumatic: Secondary | ICD-10-CM | POA: Diagnosis not present

## 2017-09-21 DIAGNOSIS — H35412 Lattice degeneration of retina, left eye: Secondary | ICD-10-CM | POA: Diagnosis not present

## 2017-09-21 DIAGNOSIS — H43812 Vitreous degeneration, left eye: Secondary | ICD-10-CM | POA: Diagnosis not present

## 2017-09-21 DIAGNOSIS — H33012 Retinal detachment with single break, left eye: Secondary | ICD-10-CM | POA: Diagnosis not present

## 2017-09-21 DIAGNOSIS — M25511 Pain in right shoulder: Secondary | ICD-10-CM | POA: Diagnosis not present

## 2017-09-21 DIAGNOSIS — M75122 Complete rotator cuff tear or rupture of left shoulder, not specified as traumatic: Secondary | ICD-10-CM | POA: Diagnosis not present

## 2017-09-24 DIAGNOSIS — M75122 Complete rotator cuff tear or rupture of left shoulder, not specified as traumatic: Secondary | ICD-10-CM | POA: Diagnosis not present

## 2017-09-24 DIAGNOSIS — M75121 Complete rotator cuff tear or rupture of right shoulder, not specified as traumatic: Secondary | ICD-10-CM | POA: Diagnosis not present

## 2017-09-24 DIAGNOSIS — M25512 Pain in left shoulder: Secondary | ICD-10-CM | POA: Diagnosis not present

## 2017-09-24 DIAGNOSIS — M25511 Pain in right shoulder: Secondary | ICD-10-CM | POA: Diagnosis not present

## 2017-09-28 DIAGNOSIS — M75122 Complete rotator cuff tear or rupture of left shoulder, not specified as traumatic: Secondary | ICD-10-CM | POA: Diagnosis not present

## 2017-09-28 DIAGNOSIS — M75121 Complete rotator cuff tear or rupture of right shoulder, not specified as traumatic: Secondary | ICD-10-CM | POA: Diagnosis not present

## 2017-09-28 DIAGNOSIS — M25512 Pain in left shoulder: Secondary | ICD-10-CM | POA: Diagnosis not present

## 2017-09-28 DIAGNOSIS — M25511 Pain in right shoulder: Secondary | ICD-10-CM | POA: Diagnosis not present

## 2017-10-01 DIAGNOSIS — M25512 Pain in left shoulder: Secondary | ICD-10-CM | POA: Diagnosis not present

## 2017-10-01 DIAGNOSIS — M75121 Complete rotator cuff tear or rupture of right shoulder, not specified as traumatic: Secondary | ICD-10-CM | POA: Diagnosis not present

## 2017-10-01 DIAGNOSIS — M65331 Trigger finger, right middle finger: Secondary | ICD-10-CM | POA: Diagnosis not present

## 2017-10-01 DIAGNOSIS — M25511 Pain in right shoulder: Secondary | ICD-10-CM | POA: Diagnosis not present

## 2017-10-01 DIAGNOSIS — E291 Testicular hypofunction: Secondary | ICD-10-CM | POA: Diagnosis not present

## 2017-10-01 DIAGNOSIS — M75122 Complete rotator cuff tear or rupture of left shoulder, not specified as traumatic: Secondary | ICD-10-CM | POA: Diagnosis not present

## 2017-10-09 DIAGNOSIS — M25511 Pain in right shoulder: Secondary | ICD-10-CM | POA: Diagnosis not present

## 2017-10-09 DIAGNOSIS — M25512 Pain in left shoulder: Secondary | ICD-10-CM | POA: Diagnosis not present

## 2017-10-09 DIAGNOSIS — M75121 Complete rotator cuff tear or rupture of right shoulder, not specified as traumatic: Secondary | ICD-10-CM | POA: Diagnosis not present

## 2017-10-09 DIAGNOSIS — M75122 Complete rotator cuff tear or rupture of left shoulder, not specified as traumatic: Secondary | ICD-10-CM | POA: Diagnosis not present

## 2017-10-11 DIAGNOSIS — M75121 Complete rotator cuff tear or rupture of right shoulder, not specified as traumatic: Secondary | ICD-10-CM | POA: Diagnosis not present

## 2017-10-11 DIAGNOSIS — M25511 Pain in right shoulder: Secondary | ICD-10-CM | POA: Diagnosis not present

## 2017-10-11 DIAGNOSIS — M75122 Complete rotator cuff tear or rupture of left shoulder, not specified as traumatic: Secondary | ICD-10-CM | POA: Diagnosis not present

## 2017-10-11 DIAGNOSIS — M25512 Pain in left shoulder: Secondary | ICD-10-CM | POA: Diagnosis not present

## 2017-10-15 DIAGNOSIS — E291 Testicular hypofunction: Secondary | ICD-10-CM | POA: Diagnosis not present

## 2017-10-29 DIAGNOSIS — E291 Testicular hypofunction: Secondary | ICD-10-CM | POA: Diagnosis not present

## 2017-11-05 DIAGNOSIS — M65322 Trigger finger, left index finger: Secondary | ICD-10-CM | POA: Diagnosis not present

## 2017-11-05 DIAGNOSIS — Z125 Encounter for screening for malignant neoplasm of prostate: Secondary | ICD-10-CM | POA: Diagnosis not present

## 2017-11-05 DIAGNOSIS — Z79899 Other long term (current) drug therapy: Secondary | ICD-10-CM | POA: Diagnosis not present

## 2017-11-12 DIAGNOSIS — Z79899 Other long term (current) drug therapy: Secondary | ICD-10-CM | POA: Diagnosis not present

## 2017-11-12 DIAGNOSIS — Z23 Encounter for immunization: Secondary | ICD-10-CM | POA: Diagnosis not present

## 2017-11-12 DIAGNOSIS — Z125 Encounter for screening for malignant neoplasm of prostate: Secondary | ICD-10-CM | POA: Diagnosis not present

## 2017-11-13 DIAGNOSIS — J069 Acute upper respiratory infection, unspecified: Secondary | ICD-10-CM | POA: Diagnosis not present

## 2017-11-19 DIAGNOSIS — E291 Testicular hypofunction: Secondary | ICD-10-CM | POA: Diagnosis not present

## 2017-11-19 DIAGNOSIS — M542 Cervicalgia: Secondary | ICD-10-CM | POA: Diagnosis not present

## 2017-12-03 DIAGNOSIS — M5412 Radiculopathy, cervical region: Secondary | ICD-10-CM | POA: Diagnosis not present

## 2017-12-03 DIAGNOSIS — M4722 Other spondylosis with radiculopathy, cervical region: Secondary | ICD-10-CM | POA: Diagnosis not present

## 2017-12-04 DIAGNOSIS — E291 Testicular hypofunction: Secondary | ICD-10-CM | POA: Diagnosis not present

## 2017-12-11 DIAGNOSIS — H43393 Other vitreous opacities, bilateral: Secondary | ICD-10-CM | POA: Diagnosis not present

## 2017-12-11 DIAGNOSIS — H35412 Lattice degeneration of retina, left eye: Secondary | ICD-10-CM | POA: Diagnosis not present

## 2017-12-11 DIAGNOSIS — H31093 Other chorioretinal scars, bilateral: Secondary | ICD-10-CM | POA: Diagnosis not present

## 2017-12-11 DIAGNOSIS — H43812 Vitreous degeneration, left eye: Secondary | ICD-10-CM | POA: Diagnosis not present

## 2017-12-18 DIAGNOSIS — D2262 Melanocytic nevi of left upper limb, including shoulder: Secondary | ICD-10-CM | POA: Diagnosis not present

## 2017-12-18 DIAGNOSIS — D225 Melanocytic nevi of trunk: Secondary | ICD-10-CM | POA: Diagnosis not present

## 2017-12-18 DIAGNOSIS — L812 Freckles: Secondary | ICD-10-CM | POA: Diagnosis not present

## 2017-12-18 DIAGNOSIS — Z85820 Personal history of malignant melanoma of skin: Secondary | ICD-10-CM | POA: Diagnosis not present

## 2017-12-18 DIAGNOSIS — D2261 Melanocytic nevi of right upper limb, including shoulder: Secondary | ICD-10-CM | POA: Diagnosis not present

## 2017-12-20 DIAGNOSIS — E291 Testicular hypofunction: Secondary | ICD-10-CM | POA: Diagnosis not present

## 2017-12-25 DIAGNOSIS — Z23 Encounter for immunization: Secondary | ICD-10-CM | POA: Diagnosis not present

## 2018-01-08 DIAGNOSIS — E291 Testicular hypofunction: Secondary | ICD-10-CM | POA: Diagnosis not present

## 2018-01-22 DIAGNOSIS — E291 Testicular hypofunction: Secondary | ICD-10-CM | POA: Diagnosis not present

## 2018-01-23 DIAGNOSIS — M65331 Trigger finger, right middle finger: Secondary | ICD-10-CM | POA: Diagnosis not present

## 2018-01-23 DIAGNOSIS — M65322 Trigger finger, left index finger: Secondary | ICD-10-CM | POA: Diagnosis not present

## 2018-02-05 DIAGNOSIS — E291 Testicular hypofunction: Secondary | ICD-10-CM | POA: Diagnosis not present

## 2018-02-18 DIAGNOSIS — E291 Testicular hypofunction: Secondary | ICD-10-CM | POA: Diagnosis not present

## 2018-02-26 DIAGNOSIS — Z209 Contact with and (suspected) exposure to unspecified communicable disease: Secondary | ICD-10-CM | POA: Diagnosis not present

## 2018-02-26 DIAGNOSIS — B853 Phthiriasis: Secondary | ICD-10-CM | POA: Diagnosis not present

## 2018-02-28 DIAGNOSIS — B853 Phthiriasis: Secondary | ICD-10-CM | POA: Diagnosis not present

## 2018-02-28 DIAGNOSIS — Z209 Contact with and (suspected) exposure to unspecified communicable disease: Secondary | ICD-10-CM | POA: Diagnosis not present

## 2018-03-11 DIAGNOSIS — E291 Testicular hypofunction: Secondary | ICD-10-CM | POA: Diagnosis not present

## 2018-03-26 DIAGNOSIS — E291 Testicular hypofunction: Secondary | ICD-10-CM | POA: Diagnosis not present

## 2018-04-09 DIAGNOSIS — E291 Testicular hypofunction: Secondary | ICD-10-CM | POA: Diagnosis not present

## 2018-04-24 DIAGNOSIS — E291 Testicular hypofunction: Secondary | ICD-10-CM | POA: Diagnosis not present

## 2018-05-08 DIAGNOSIS — Z125 Encounter for screening for malignant neoplasm of prostate: Secondary | ICD-10-CM | POA: Diagnosis not present

## 2018-05-08 DIAGNOSIS — E7800 Pure hypercholesterolemia, unspecified: Secondary | ICD-10-CM | POA: Diagnosis not present

## 2018-05-08 DIAGNOSIS — I10 Essential (primary) hypertension: Secondary | ICD-10-CM | POA: Diagnosis not present

## 2018-05-08 DIAGNOSIS — E291 Testicular hypofunction: Secondary | ICD-10-CM | POA: Diagnosis not present

## 2018-05-08 DIAGNOSIS — Z79899 Other long term (current) drug therapy: Secondary | ICD-10-CM | POA: Diagnosis not present

## 2018-05-15 DIAGNOSIS — I10 Essential (primary) hypertension: Secondary | ICD-10-CM | POA: Diagnosis not present

## 2018-05-15 DIAGNOSIS — B001 Herpesviral vesicular dermatitis: Secondary | ICD-10-CM | POA: Diagnosis not present

## 2018-05-15 DIAGNOSIS — M109 Gout, unspecified: Secondary | ICD-10-CM | POA: Diagnosis not present

## 2018-05-15 DIAGNOSIS — E7800 Pure hypercholesterolemia, unspecified: Secondary | ICD-10-CM | POA: Diagnosis not present

## 2018-05-15 DIAGNOSIS — E291 Testicular hypofunction: Secondary | ICD-10-CM | POA: Diagnosis not present

## 2018-05-15 DIAGNOSIS — E871 Hypo-osmolality and hyponatremia: Secondary | ICD-10-CM | POA: Diagnosis not present

## 2018-05-15 DIAGNOSIS — Z0000 Encounter for general adult medical examination without abnormal findings: Secondary | ICD-10-CM | POA: Diagnosis not present

## 2018-05-28 DIAGNOSIS — E291 Testicular hypofunction: Secondary | ICD-10-CM | POA: Diagnosis not present

## 2018-06-10 DIAGNOSIS — M65322 Trigger finger, left index finger: Secondary | ICD-10-CM | POA: Diagnosis not present

## 2018-06-10 DIAGNOSIS — M65331 Trigger finger, right middle finger: Secondary | ICD-10-CM | POA: Diagnosis not present

## 2018-06-11 DIAGNOSIS — E291 Testicular hypofunction: Secondary | ICD-10-CM | POA: Diagnosis not present

## 2018-06-25 DIAGNOSIS — E291 Testicular hypofunction: Secondary | ICD-10-CM | POA: Diagnosis not present

## 2018-06-27 ENCOUNTER — Telehealth: Payer: Self-pay | Admitting: Cardiovascular Disease

## 2018-06-27 NOTE — Telephone Encounter (Signed)
New message     LMOM to call and reschedule June 5th office visit.

## 2018-07-04 NOTE — Telephone Encounter (Signed)
Patient called back and moved appt to July 1st.

## 2018-07-09 DIAGNOSIS — E291 Testicular hypofunction: Secondary | ICD-10-CM | POA: Diagnosis not present

## 2018-07-10 DIAGNOSIS — L245 Irritant contact dermatitis due to other chemical products: Secondary | ICD-10-CM | POA: Diagnosis not present

## 2018-07-12 ENCOUNTER — Ambulatory Visit: Payer: Medicare Other | Admitting: Cardiovascular Disease

## 2018-07-24 DIAGNOSIS — N528 Other male erectile dysfunction: Secondary | ICD-10-CM | POA: Diagnosis not present

## 2018-08-05 NOTE — Progress Notes (Signed)
Virtual Visit via Telephone Note   This visit type was conducted due to national recommendations for restrictions regarding the COVID-19 Pandemic (e.g. social distancing) in an effort to limit this patient's exposure and mitigate transmission in our community.  Due to his co-morbid illnesses, this patient is at least at moderate risk for complications without adequate follow up.  This format is felt to be most appropriate for this patient at this time.  The patient did not have access to video technology/had technical difficulties with video requiring transitioning to audio format only (telephone).  All issues noted in this document were discussed and addressed.  No physical exam could be performed with this format.  Please refer to the patient's chart for his  consent to telehealth for [facility].   Date:  08/07/2018   ID:  Alexander Anderson., DOB 19-Oct-1945, MRN [id number]  Patient Location: Home Provider Location: Office  PCP:  Lawerance Cruel, MD  Cardiologist:   Johnsie Cancel Electrophysiologist:  None   Evaluation Performed:  Follow-Up Visit  Chief Complaint:  PVC/HTN   History of Present Illness:    73 y.o.  previously seen by Dr Marlou Porch.  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  Chronic HTN on Rx White coat component Runs good at home  Denies excess NSAI's , ETOH  Compliant with meds.  Does not note PVCls no palpitations or syncope.  Still working in Production assistant, radio Like to ride [place].  Works out pretty regularly with no chest pain    F/U echo :  Reviewed  05/08/13  Normal  Study Conclusions  Left ventricle: The cavity size was normal. Systolic function was normal. The estimated ejection fraction was in the range of 60% to 65%. Wall motion was normal; there were no regional wall motion abnormalities.  ETT 05/08/13  Normal exercised 10 mintues   Diuretic stopped  due to gout BP running higher at home Has issue with right heel ? Bursitis has had  steroid injection but recurred And relafen not helping   Had a subdural hematoma during skiing accident in March 2019 with improvement And no mass effect on last CT done 04/30/17  Some dependant edema on motorcycle helped by lasix Has trainer 3x/week more active   The patient  does not have symptoms concerning for COVID-19 infection (fever, chills, cough, or new shortness of breath).    Past Medical History:  Diagnosis Date  . Adrenal insufficiency ([place])   . Cataract   . Dysplastic nevus    [name]  . HTN (hypertension)    Benign  . Hypercholesterolemia   . Low testosterone   . Lower back pain   . PVC's (premature ventricular contractions)    STRESS TEST IN [facility]   Past Surgical History:  Procedure Laterality Date  . CATARACT EXTRACTION, BILATERAL  2013   Bil  . RETINAL DETACHMENT SURGERY     RIGHT EYE  . SHOULDER SURGERY  2008   Left, right     Current Meds  Medication Sig  . cholecalciferol (VITAMIN D) 1000 UNITS tablet Take 1,000 Units by mouth daily.  . furosemide (LASIX) 20 MG tablet Take 20 mg by mouth daily.  Marland Kitchen glucosamine-chondroitin 500-400 MG tablet Take 1 tablet by mouth daily.  Marland Kitchen L-LYSINE PO Take 1 tablet by mouth daily.   . milk thistle 175 MG tablet Take 175 mg by mouth daily.  . nabumetone (RELAFEN) 500 MG tablet Take 500 mg by mouth 2 (two) times  daily.  . Omega-3 Fatty Acids (FISH OIL) 1000 MG CAPS Take 1 capsule by mouth daily.   Marland Kitchen testosterone (ANDROGEL) 50 MG/5GM (1%) GEL Apply topically as directed.  . TURMERIC PO Take 1 tablet by mouth daily.  . valACYclovir (VALTREX) 1000 MG tablet Take 1,000 mg by mouth as directed.   . valsartan (DIOVAN) 320 MG tablet Take 320 mg by mouth daily.  . vitamin E 100 UNIT capsule Take 100 Units by mouth daily.      Allergies:   Ciprofloxacin and Penicillins   Social History   Tobacco Use  . Smoking status: Never Smoker  . Smokeless tobacco: Never Used  Substance Use Topics  . Alcohol use: Yes     Alcohol/week: 7.0 standard drinks    Types: 7 Standard drinks or equivalent per week  . Drug use: No     Family Hx: The patient's family history includes Atrial fibrillation in his mother; Heart disease in his sister; Ovarian cancer in his mother.  ROS:   Please see the history of present illness.     All other systems reviewed and are negative.   Prior CV studies:   The following studies were reviewed today:  Echo :  05/08/13   Labs/Other Tests and Data Reviewed:    EKG:  Not done this visit   Recent Labs: No results found for requested labs within last 8760 hours.   Recent Lipid Panel No results found for: CHOL, TRIG, HDL, CHOLHDL, LDLCALC, LDLDIRECT  Wt Readings from Last 3 Encounters:  08/07/18 204 lb (92.5 kg)  07/11/17 195 lb (88.5 kg)  05/10/16 211 lb 12.8 oz (96.1 kg)     Objective:    Vital Signs:  BP 131/69   Pulse 62   Ht 6\' 2"  (1.88 m)   Wt 204 lb (92.5 kg)   BMI 26.19 kg/m    Skin warm and dry No distress No tachypnea No JVP elevation  Neuro appears non focal No edema   ASSESSMENT & PLAN:    PVC;s: asymptomatic take mag-oxide with diuretic no structural heart disease  HTN: improved on lasix now  Low T: continue replacement  Gout: Low red meet and less ETOH f/u Ross consider uloric   Edema:  Dependant lasix as needed compression stockings on motorcycle   Subdural Hematoma: traumatic improved on latest CT f/u neuro   COVID-19 Education: The signs and symptoms of COVID-19 were discussed with the patient and how to seek care for testing (follow up with PCP or arrange E-visit).  The importance of social distancing was discussed today.  Time:   Today, I have spent 30 minutes with the patient with telehealth technology discussing the above problems.     Medication Adjustments/Labs and Tests Ordered: Current medicines are reviewed at length with the patient today.  Concerns regarding medicines are outlined above.   Tests Ordered:  None    Medication Changes:  None  Disposition:  Follow up in a year  Signed, Jenkins Rouge, MD  08/07/2018 9:32 AM    [place]

## 2018-08-06 ENCOUNTER — Telehealth: Payer: Self-pay | Admitting: Cardiovascular Disease

## 2018-08-06 DIAGNOSIS — E291 Testicular hypofunction: Secondary | ICD-10-CM | POA: Diagnosis not present

## 2018-08-06 NOTE — Telephone Encounter (Signed)

## 2018-08-07 ENCOUNTER — Other Ambulatory Visit: Payer: Self-pay

## 2018-08-07 ENCOUNTER — Encounter: Payer: Self-pay | Admitting: Cardiovascular Disease

## 2018-08-07 ENCOUNTER — Telehealth (INDEPENDENT_AMBULATORY_CARE_PROVIDER_SITE_OTHER): Payer: Medicare Other | Admitting: Cardiovascular Disease

## 2018-08-07 VITALS — BP 131/69 | HR 62 | Ht 74.0 in | Wt 204.0 lb

## 2018-08-07 DIAGNOSIS — I10 Essential (primary) hypertension: Secondary | ICD-10-CM

## 2018-08-07 DIAGNOSIS — I493 Ventricular premature depolarization: Secondary | ICD-10-CM

## 2018-08-07 NOTE — Patient Instructions (Signed)
Medication Instructions:  Your physician recommends that you continue on your current medications as directed. Please refer to the Current Medication list given to you today.  If you need a refill on your cardiac medications before your next appointment, please call your pharmacy.    Lab work: None Ordered    Testing/Procedures: None Ordered   Follow-Up: At [facility], you and your health needs are our priority.  As part of our continuing mission to provide you with exceptional heart care, we have created designated Provider Care Teams.  These Care Teams include your primary Cardiologist (physician) and Advanced Practice Providers (APPs -  Physician Assistants and Nurse Practitioners) who all work together to provide you with the care you need, when you need it. You will need a follow up appointment in 1 years.  Please call our office 2 months in advance to schedule this appointment.  You may see Jenkins Rouge, MD or one of the following Advanced Practice Providers on your designated Care Team:   Truitt Merle, NP Cecilie Kicks, NP . Kathyrn Drown, NP

## 2018-08-19 DIAGNOSIS — E291 Testicular hypofunction: Secondary | ICD-10-CM | POA: Diagnosis not present

## 2018-09-03 DIAGNOSIS — E291 Testicular hypofunction: Secondary | ICD-10-CM | POA: Diagnosis not present

## 2018-09-16 DIAGNOSIS — E291 Testicular hypofunction: Secondary | ICD-10-CM | POA: Diagnosis not present

## 2018-09-30 DIAGNOSIS — E291 Testicular hypofunction: Secondary | ICD-10-CM | POA: Diagnosis not present

## 2018-10-15 DIAGNOSIS — E291 Testicular hypofunction: Secondary | ICD-10-CM | POA: Diagnosis not present

## 2018-10-23 DIAGNOSIS — L570 Actinic keratosis: Secondary | ICD-10-CM | POA: Diagnosis not present

## 2018-10-29 DIAGNOSIS — E291 Testicular hypofunction: Secondary | ICD-10-CM | POA: Diagnosis not present

## 2018-11-04 DIAGNOSIS — Z23 Encounter for immunization: Secondary | ICD-10-CM | POA: Diagnosis not present

## 2018-11-12 DIAGNOSIS — E291 Testicular hypofunction: Secondary | ICD-10-CM | POA: Diagnosis not present

## 2018-11-18 DIAGNOSIS — H9190 Unspecified hearing loss, unspecified ear: Secondary | ICD-10-CM | POA: Diagnosis not present

## 2018-11-18 DIAGNOSIS — E291 Testicular hypofunction: Secondary | ICD-10-CM | POA: Diagnosis not present

## 2018-11-20 DIAGNOSIS — M65331 Trigger finger, right middle finger: Secondary | ICD-10-CM | POA: Diagnosis not present

## 2018-11-21 DIAGNOSIS — E291 Testicular hypofunction: Secondary | ICD-10-CM | POA: Diagnosis not present

## 2018-11-28 DIAGNOSIS — E291 Testicular hypofunction: Secondary | ICD-10-CM | POA: Diagnosis not present

## 2018-12-16 DIAGNOSIS — E291 Testicular hypofunction: Secondary | ICD-10-CM | POA: Diagnosis not present

## 2018-12-23 DIAGNOSIS — L812 Freckles: Secondary | ICD-10-CM | POA: Diagnosis not present

## 2018-12-23 DIAGNOSIS — Z85820 Personal history of malignant melanoma of skin: Secondary | ICD-10-CM | POA: Diagnosis not present

## 2018-12-23 DIAGNOSIS — L821 Other seborrheic keratosis: Secondary | ICD-10-CM | POA: Diagnosis not present

## 2018-12-23 DIAGNOSIS — D225 Melanocytic nevi of trunk: Secondary | ICD-10-CM | POA: Diagnosis not present

## 2018-12-23 DIAGNOSIS — L218 Other seborrheic dermatitis: Secondary | ICD-10-CM | POA: Diagnosis not present

## 2018-12-31 DIAGNOSIS — E291 Testicular hypofunction: Secondary | ICD-10-CM | POA: Diagnosis not present

## 2019-02-12 DIAGNOSIS — E291 Testicular hypofunction: Secondary | ICD-10-CM | POA: Diagnosis not present

## 2019-02-25 DIAGNOSIS — E291 Testicular hypofunction: Secondary | ICD-10-CM | POA: Diagnosis not present

## 2019-03-12 DIAGNOSIS — E291 Testicular hypofunction: Secondary | ICD-10-CM | POA: Diagnosis not present

## 2019-03-18 DIAGNOSIS — H903 Sensorineural hearing loss, bilateral: Secondary | ICD-10-CM | POA: Diagnosis not present

## 2019-03-26 DIAGNOSIS — M65331 Trigger finger, right middle finger: Secondary | ICD-10-CM | POA: Diagnosis not present

## 2019-03-26 DIAGNOSIS — M65342 Trigger finger, left ring finger: Secondary | ICD-10-CM | POA: Diagnosis not present

## 2019-03-26 DIAGNOSIS — M65332 Trigger finger, left middle finger: Secondary | ICD-10-CM | POA: Diagnosis not present

## 2019-03-28 DIAGNOSIS — E291 Testicular hypofunction: Secondary | ICD-10-CM | POA: Diagnosis not present

## 2019-04-09 DIAGNOSIS — E291 Testicular hypofunction: Secondary | ICD-10-CM | POA: Diagnosis not present

## 2019-04-23 DIAGNOSIS — E291 Testicular hypofunction: Secondary | ICD-10-CM | POA: Diagnosis not present

## 2019-05-07 DIAGNOSIS — E291 Testicular hypofunction: Secondary | ICD-10-CM | POA: Diagnosis not present

## 2019-05-08 DIAGNOSIS — H02834 Dermatochalasis of left upper eyelid: Secondary | ICD-10-CM | POA: Diagnosis not present

## 2019-05-08 DIAGNOSIS — H02831 Dermatochalasis of right upper eyelid: Secondary | ICD-10-CM | POA: Diagnosis not present

## 2019-05-14 DIAGNOSIS — R05 Cough: Secondary | ICD-10-CM | POA: Diagnosis not present

## 2019-05-14 DIAGNOSIS — R0981 Nasal congestion: Secondary | ICD-10-CM | POA: Diagnosis not present

## 2019-05-14 DIAGNOSIS — R52 Pain, unspecified: Secondary | ICD-10-CM | POA: Diagnosis not present

## 2019-05-14 DIAGNOSIS — R0989 Other specified symptoms and signs involving the circulatory and respiratory systems: Secondary | ICD-10-CM | POA: Diagnosis not present

## 2019-05-20 DIAGNOSIS — E291 Testicular hypofunction: Secondary | ICD-10-CM | POA: Diagnosis not present

## 2019-05-29 DIAGNOSIS — L570 Actinic keratosis: Secondary | ICD-10-CM | POA: Diagnosis not present

## 2019-05-29 DIAGNOSIS — L821 Other seborrheic keratosis: Secondary | ICD-10-CM | POA: Diagnosis not present

## 2019-05-29 DIAGNOSIS — D2239 Melanocytic nevi of other parts of face: Secondary | ICD-10-CM | POA: Diagnosis not present

## 2019-06-03 DIAGNOSIS — E291 Testicular hypofunction: Secondary | ICD-10-CM | POA: Diagnosis not present

## 2019-06-16 DIAGNOSIS — E291 Testicular hypofunction: Secondary | ICD-10-CM | POA: Diagnosis not present

## 2019-06-24 DIAGNOSIS — I10 Essential (primary) hypertension: Secondary | ICD-10-CM | POA: Diagnosis not present

## 2019-06-24 DIAGNOSIS — E7800 Pure hypercholesterolemia, unspecified: Secondary | ICD-10-CM | POA: Diagnosis not present

## 2019-06-27 DIAGNOSIS — M109 Gout, unspecified: Secondary | ICD-10-CM | POA: Diagnosis not present

## 2019-06-27 DIAGNOSIS — E291 Testicular hypofunction: Secondary | ICD-10-CM | POA: Diagnosis not present

## 2019-06-27 DIAGNOSIS — I10 Essential (primary) hypertension: Secondary | ICD-10-CM | POA: Diagnosis not present

## 2019-06-27 DIAGNOSIS — E7800 Pure hypercholesterolemia, unspecified: Secondary | ICD-10-CM | POA: Diagnosis not present

## 2019-06-27 DIAGNOSIS — Z125 Encounter for screening for malignant neoplasm of prostate: Secondary | ICD-10-CM | POA: Diagnosis not present

## 2019-07-02 DIAGNOSIS — M109 Gout, unspecified: Secondary | ICD-10-CM | POA: Diagnosis not present

## 2019-07-02 DIAGNOSIS — E291 Testicular hypofunction: Secondary | ICD-10-CM | POA: Diagnosis not present

## 2019-07-02 DIAGNOSIS — Z0000 Encounter for general adult medical examination without abnormal findings: Secondary | ICD-10-CM | POA: Diagnosis not present

## 2019-07-02 DIAGNOSIS — Z125 Encounter for screening for malignant neoplasm of prostate: Secondary | ICD-10-CM | POA: Diagnosis not present

## 2019-07-02 DIAGNOSIS — I10 Essential (primary) hypertension: Secondary | ICD-10-CM | POA: Diagnosis not present

## 2019-07-02 DIAGNOSIS — R0981 Nasal congestion: Secondary | ICD-10-CM | POA: Diagnosis not present

## 2019-07-02 DIAGNOSIS — E7800 Pure hypercholesterolemia, unspecified: Secondary | ICD-10-CM | POA: Diagnosis not present

## 2019-07-02 DIAGNOSIS — Z79899 Other long term (current) drug therapy: Secondary | ICD-10-CM | POA: Diagnosis not present

## 2019-07-16 ENCOUNTER — Other Ambulatory Visit: Payer: Self-pay

## 2019-07-16 ENCOUNTER — Encounter (INDEPENDENT_AMBULATORY_CARE_PROVIDER_SITE_OTHER): Payer: Self-pay | Admitting: Otolaryngology

## 2019-07-16 ENCOUNTER — Ambulatory Visit (INDEPENDENT_AMBULATORY_CARE_PROVIDER_SITE_OTHER): Payer: PPO | Admitting: Otolaryngology

## 2019-07-16 VITALS — Temp 97.5°F

## 2019-07-16 DIAGNOSIS — J310 Chronic rhinitis: Secondary | ICD-10-CM

## 2019-07-16 DIAGNOSIS — J019 Acute sinusitis, unspecified: Secondary | ICD-10-CM | POA: Diagnosis not present

## 2019-07-16 DIAGNOSIS — E291 Testicular hypofunction: Secondary | ICD-10-CM | POA: Diagnosis not present

## 2019-07-16 NOTE — Progress Notes (Signed)
HPI: Alexander Wilmer. is a 74 y.o. male who presents is referred by Dr. Harrington Challenger for evaluation of sinus complaints.  Patient has previously been followed by Dr. Ernesto Rutherford and on review of his records he apparently has had a history of MRSA of the nasal vestibule.  He has had occasional sinus infections.  He is allergic to penicillin and has been treated by Dr. Ernesto Rutherford with Ceftin and doxycycline in the past. Apparently in April he developed a sinus infection with secondary bronchitis and was treated with a Z-Pak.  He got better but then had recurrent infection and was treated with another round of Z-Pak in May.  And more recently a couple weeks ago he had persistent symptoms and was started on clarithromycin twice daily for 5 days they just finished yesterday and seems to be doing a lot better.  He is presently not using any nasal steroid sprays.. He does have some drainage from his nose but this is generally clear.  His bronchitis and cough is doing much better.  Past Medical History:  Diagnosis Date  . Adrenal insufficiency ([place])   . Cataract   . Dysplastic nevus    [name]  . HTN (hypertension)    Benign  . Hypercholesterolemia   . Low testosterone   . Lower back pain   . PVC's (premature ventricular contractions)    STRESS TEST IN [facility]   Past Surgical History:  Procedure Laterality Date  . CATARACT EXTRACTION, BILATERAL  2013   Bil  . RETINAL DETACHMENT SURGERY     RIGHT EYE  . SHOULDER SURGERY  2008   Left, right   Social History   Socioeconomic History  . Marital status: Married    Spouse name: Not on file  . Number of children: Not on file  . Years of education: Not on file  . Highest education level: Not on file  Occupational History  . Not on file  Tobacco Use  . Smoking status: Never Smoker  . Smokeless tobacco: Never Used  Substance and Sexual Activity  . Alcohol use: Yes    Alcohol/week: 7.0 standard drinks    Types: 7 Standard drinks or equivalent per week   . Drug use: No  . Sexual activity: Not on file  Other Topics Concern  . Not on file  Social History Narrative  . Not on file   Social Determinants of Health   Financial Resource Strain:   . Difficulty of Paying Living Expenses:   Food Insecurity:   . Worried About Charity fundraiser in the Last Year:   . Arboriculturist in the Last Year:   Transportation Needs:   . Film/video editor (Medical):   Marland Kitchen Lack of Transportation (Non-Medical):   Physical Activity:   . Days of Exercise per Week:   . Minutes of Exercise per Session:   Stress:   . Feeling of Stress :   Social Connections:   . Frequency of Communication with Friends and Family:   . Frequency of Social Gatherings with Friends and Family:   . Attends Religious Services:   . Active Member of Clubs or Organizations:   . Attends Archivist Meetings:   Marland Kitchen Marital Status:    Family History  Problem Relation Age of Onset  . Atrial fibrillation Mother   . Ovarian cancer Mother   . Heart disease Sister    Allergies  Allergen Reactions  . Ciprofloxacin     CAUSED SORE THROAT  .  Penicillins     CAUSED FACE TO TURN RED AS CHILD.   Prior to Admission medications   Medication Sig Start Date End Date Taking? Authorizing Provider  cholecalciferol (VITAMIN D) 1000 UNITS tablet Take 1,000 Units by mouth daily.   Yes [provider]  furosemide (LASIX) 20 MG tablet Take 20 mg by mouth daily.   Yes [provider]  glucosamine-chondroitin 500-400 MG tablet Take 1 tablet by mouth daily.   Yes [provider]  L-LYSINE PO Take 1 tablet by mouth daily.    Yes [provider]  milk thistle 175 MG tablet Take 175 mg by mouth daily.   Yes [provider]  nabumetone (RELAFEN) 500 MG tablet Take 500 mg by mouth 2 (two) times daily.   Yes [provider]  Omega-3 Fatty Acids (FISH OIL) 1000 MG CAPS Take 1 capsule by mouth daily.    Yes [provider]   testosterone (ANDROGEL) 50 MG/5GM (1%) GEL Apply topically as directed. 03/20/13  Yes [provider]  TURMERIC PO Take 1 tablet by mouth daily.   Yes [provider]  valACYclovir (VALTREX) 1000 MG tablet Take 1,000 mg by mouth as directed.  03/17/13  Yes [provider]  valsartan (DIOVAN) 320 MG tablet Take 320 mg by mouth daily.   Yes [provider]  vitamin E 100 UNIT capsule Take 100 Units by mouth daily.    Yes [provider]     Positive ROS: Otherwise negative  All other systems have been reviewed and were otherwise negative with the exception of those mentioned in the HPI and as above.  Physical Exam: Constitutional: Alert, well-appearing, no acute distress Ears: External ears without lesions or tenderness. Ear canals are clear bilaterally with intact, clear TMs.  Nasal: External nose without lesions. Septum slightly deviated to the left..  Nasal endoscopy was performed in the office today and on nasal endoscopy both middle meatus regions were clear anteriorly as well as posteriorly.  No obvious mucopurulent discharge noted on either side.  He has mild rhinitis with only clear mucus discharge. Oral: Lips and gums without lesions. Tongue and palate mucosa without lesions. Posterior oropharynx clear. Neck: No palpable adenopathy or masses Respiratory: Breathing comfortably  Skin: No facial/neck lesions or rash noted.  Nasal/sinus endoscopy  Date/Time: 07/16/2019 1:06 PM Performed by: Rozetta Nunnery, MD Authorized by: Rozetta Nunnery, MD   Consent:    Consent obtained:  Verbal   Consent given by:  Patient Procedure details:    Indications: sino-nasal symptoms     Medication:  Afrin   Instrument: flexible fiberoptic nasal endoscope     Scope location: bilateral nare   Septum:    Deviation: deviated to the left   Sinus:    Right middle meatus: normal     Left middle meatus: normal     Right nasopharynx: normal      Left nasopharynx: normal   Comments:     On nasal endoscopy both middle meatus regions were clear with no clinical evidence presently of active or acute infection.    Assessment: Chronic rhinitis with history of sinus symptoms for the past 6 weeks.  He has been doing better since starting clarithromycin.  Plan: Prescribed Nasacort 2 sprays each nostril at night and would recommend using this for the next 2 or 3 months.  This should help with congestion as well as pressure in the sinuses. Also prescribed another 7 days of clindamycin 500 mg twice  daily as apparently this has been effective recently but he only took 5 days which he completed yesterday.   Radene Journey, MD   CC:

## 2019-07-29 DIAGNOSIS — E291 Testicular hypofunction: Secondary | ICD-10-CM | POA: Diagnosis not present

## 2019-08-13 DIAGNOSIS — E291 Testicular hypofunction: Secondary | ICD-10-CM | POA: Diagnosis not present

## 2019-08-27 DIAGNOSIS — E291 Testicular hypofunction: Secondary | ICD-10-CM | POA: Diagnosis not present

## 2019-08-29 ENCOUNTER — Ambulatory Visit: Payer: Medicare Other | Admitting: Cardiovascular Disease

## 2019-09-04 DIAGNOSIS — M545 Low back pain: Secondary | ICD-10-CM | POA: Diagnosis not present

## 2019-09-08 DIAGNOSIS — E291 Testicular hypofunction: Secondary | ICD-10-CM | POA: Diagnosis not present

## 2019-09-09 DIAGNOSIS — M9903 Segmental and somatic dysfunction of lumbar region: Secondary | ICD-10-CM | POA: Diagnosis not present

## 2019-09-09 DIAGNOSIS — M5137 Other intervertebral disc degeneration, lumbosacral region: Secondary | ICD-10-CM | POA: Diagnosis not present

## 2019-09-09 DIAGNOSIS — M9904 Segmental and somatic dysfunction of sacral region: Secondary | ICD-10-CM | POA: Diagnosis not present

## 2019-09-09 DIAGNOSIS — M5135 Other intervertebral disc degeneration, thoracolumbar region: Secondary | ICD-10-CM | POA: Diagnosis not present

## 2019-09-09 DIAGNOSIS — M9902 Segmental and somatic dysfunction of thoracic region: Secondary | ICD-10-CM | POA: Diagnosis not present

## 2019-09-09 DIAGNOSIS — M5136 Other intervertebral disc degeneration, lumbar region: Secondary | ICD-10-CM | POA: Diagnosis not present

## 2019-09-11 DIAGNOSIS — M5136 Other intervertebral disc degeneration, lumbar region: Secondary | ICD-10-CM | POA: Diagnosis not present

## 2019-09-11 DIAGNOSIS — M9902 Segmental and somatic dysfunction of thoracic region: Secondary | ICD-10-CM | POA: Diagnosis not present

## 2019-09-11 DIAGNOSIS — M5135 Other intervertebral disc degeneration, thoracolumbar region: Secondary | ICD-10-CM | POA: Diagnosis not present

## 2019-09-11 DIAGNOSIS — M9903 Segmental and somatic dysfunction of lumbar region: Secondary | ICD-10-CM | POA: Diagnosis not present

## 2019-09-11 DIAGNOSIS — M9904 Segmental and somatic dysfunction of sacral region: Secondary | ICD-10-CM | POA: Diagnosis not present

## 2019-09-11 DIAGNOSIS — M5137 Other intervertebral disc degeneration, lumbosacral region: Secondary | ICD-10-CM | POA: Diagnosis not present

## 2019-09-16 DIAGNOSIS — M5137 Other intervertebral disc degeneration, lumbosacral region: Secondary | ICD-10-CM | POA: Diagnosis not present

## 2019-09-16 DIAGNOSIS — M9904 Segmental and somatic dysfunction of sacral region: Secondary | ICD-10-CM | POA: Diagnosis not present

## 2019-09-16 DIAGNOSIS — M9902 Segmental and somatic dysfunction of thoracic region: Secondary | ICD-10-CM | POA: Diagnosis not present

## 2019-09-16 DIAGNOSIS — M5136 Other intervertebral disc degeneration, lumbar region: Secondary | ICD-10-CM | POA: Diagnosis not present

## 2019-09-16 DIAGNOSIS — M5135 Other intervertebral disc degeneration, thoracolumbar region: Secondary | ICD-10-CM | POA: Diagnosis not present

## 2019-09-16 DIAGNOSIS — M9903 Segmental and somatic dysfunction of lumbar region: Secondary | ICD-10-CM | POA: Diagnosis not present

## 2019-09-22 DIAGNOSIS — E291 Testicular hypofunction: Secondary | ICD-10-CM | POA: Diagnosis not present

## 2019-09-25 DIAGNOSIS — M9903 Segmental and somatic dysfunction of lumbar region: Secondary | ICD-10-CM | POA: Diagnosis not present

## 2019-09-25 DIAGNOSIS — M5135 Other intervertebral disc degeneration, thoracolumbar region: Secondary | ICD-10-CM | POA: Diagnosis not present

## 2019-09-25 DIAGNOSIS — M9904 Segmental and somatic dysfunction of sacral region: Secondary | ICD-10-CM | POA: Diagnosis not present

## 2019-09-25 DIAGNOSIS — M5136 Other intervertebral disc degeneration, lumbar region: Secondary | ICD-10-CM | POA: Diagnosis not present

## 2019-09-25 DIAGNOSIS — M9902 Segmental and somatic dysfunction of thoracic region: Secondary | ICD-10-CM | POA: Diagnosis not present

## 2019-09-25 DIAGNOSIS — M5137 Other intervertebral disc degeneration, lumbosacral region: Secondary | ICD-10-CM | POA: Diagnosis not present

## 2019-10-02 DIAGNOSIS — I10 Essential (primary) hypertension: Secondary | ICD-10-CM | POA: Diagnosis not present

## 2019-10-02 DIAGNOSIS — M9902 Segmental and somatic dysfunction of thoracic region: Secondary | ICD-10-CM | POA: Diagnosis not present

## 2019-10-02 DIAGNOSIS — M5136 Other intervertebral disc degeneration, lumbar region: Secondary | ICD-10-CM | POA: Diagnosis not present

## 2019-10-02 DIAGNOSIS — M5137 Other intervertebral disc degeneration, lumbosacral region: Secondary | ICD-10-CM | POA: Diagnosis not present

## 2019-10-02 DIAGNOSIS — E7800 Pure hypercholesterolemia, unspecified: Secondary | ICD-10-CM | POA: Diagnosis not present

## 2019-10-02 DIAGNOSIS — M9904 Segmental and somatic dysfunction of sacral region: Secondary | ICD-10-CM | POA: Diagnosis not present

## 2019-10-02 DIAGNOSIS — M5135 Other intervertebral disc degeneration, thoracolumbar region: Secondary | ICD-10-CM | POA: Diagnosis not present

## 2019-10-02 DIAGNOSIS — M9903 Segmental and somatic dysfunction of lumbar region: Secondary | ICD-10-CM | POA: Diagnosis not present

## 2019-10-06 DIAGNOSIS — Z23 Encounter for immunization: Secondary | ICD-10-CM | POA: Diagnosis not present

## 2019-10-06 DIAGNOSIS — E291 Testicular hypofunction: Secondary | ICD-10-CM | POA: Diagnosis not present

## 2019-10-06 DIAGNOSIS — T148XXA Other injury of unspecified body region, initial encounter: Secondary | ICD-10-CM | POA: Diagnosis not present

## 2019-10-08 NOTE — Progress Notes (Signed)
Date:  10/15/2019   ID:  Posey Rea., DOB 1946-01-09, MRN [id number]  PCP:  Lawerance Cruel, MD  Cardiologist:   Johnsie Cancel Electrophysiologist:  None   Evaluation Performed:  Follow-Up Visit  Chief Complaint:  PVC/HTN   History of Present Illness:    74 y.o.  previously seen by Dr Marlou Porch.  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  Chronic HTN on Rx White coat component Runs good at home  Denies excess NSAI's , ETOH  Compliant with meds.  Does not note PVCls no palpitations or syncope.  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  Works out pretty regularly with no chest pain    F/U echo :  Reviewed  05/08/13  Normal  Study Conclusions  Left ventricle: The cavity size was normal. Systolic function was normal. The estimated ejection fraction was in the range of 60% to 65%. Wall motion was normal; there were no regional wall motion abnormalities.  ETT 05/08/13  Normal exercised 10 mintues   Diuretic stopped  due to gout BP running higher at home Has issue with right heel ? Bursitis has had steroid injection but recurred And relafen not helping   Had a subdural hematoma during skiing accident in March 2019 with improvement And no mass effect on last CT done 04/30/17  Some dependant edema on motorcycle helped by lasix Has trainer 3x/week more active   Seen by ENT June 2021 for chronic rhinitis Dr Lucia Gaskins   Has had vaccine Son in [place] with Child psychotherapist roofing and on in [place]  He is still doing Nutritional therapist   Some white coat HTN Compliant with meds and BP fine at home   The patient  does not have symptoms concerning for COVID-19 infection (fever, chills, cough, or new shortness of breath).    Past Medical History:  Diagnosis Date  . Adrenal insufficiency ([place])   . Cataract   . Dysplastic nevus    [name]  . HTN (hypertension)    Benign  . Hypercholesterolemia   . Low testosterone   . Lower back pain   .  PVC's (premature ventricular contractions)    STRESS TEST IN [facility]   Past Surgical History:  Procedure Laterality Date  . CATARACT EXTRACTION, BILATERAL  2013   Bil  . RETINAL DETACHMENT SURGERY     RIGHT EYE  . SHOULDER SURGERY  2008   Left, right     Current Meds  Medication Sig  . cholecalciferol (VITAMIN D) 1000 UNITS tablet Take 1,000 Units by mouth daily.  . furosemide (LASIX) 20 MG tablet Take 20 mg by mouth daily.  Marland Kitchen glucosamine-chondroitin 500-400 MG tablet Take 1 tablet by mouth daily.  Marland Kitchen L-LYSINE PO Take 1 tablet by mouth daily.   . milk thistle 175 MG tablet Take 175 mg by mouth daily.  . Omega-3 Fatty Acids (FISH OIL) 1000 MG CAPS Take 1 capsule by mouth daily.   Marland Kitchen testosterone cypionate (DEPOTESTOSTERONE CYPIONATE) 200 MG/ML injection Inject 200 mg into the muscle every 14 (fourteen) days.  . TURMERIC PO Take 1 tablet by mouth daily.  . valACYclovir (VALTREX) 1000 MG tablet Take 1,000 mg by mouth as directed. Fever blister  . valsartan (DIOVAN) 320 MG tablet Take 320 mg by mouth daily.  . vitamin E 100 UNIT capsule Take 100 Units by mouth daily.      Allergies:   Amlodipine besylate, Ciprofloxacin, Clomiphene, Irbesartan, Oseltamivir, Other, and Penicillins  Social History   Tobacco Use  . Smoking status: Never Smoker  . Smokeless tobacco: Never Used  Substance Use Topics  . Alcohol use: Yes    Alcohol/week: 7.0 standard drinks    Types: 7 Standard drinks or equivalent per week  . Drug use: No     Family Hx: The patient's family history includes Atrial fibrillation in his mother; Heart disease in his sister; Ovarian cancer in his mother.  ROS:   Please see the history of present illness.     All other systems reviewed and are negative.   Prior CV studies:   The following studies were reviewed today:  Echo :  05/08/13   Labs/Other Tests and Data Reviewed:    EKG:   SR normal rate 75  Recent Labs: No results found for requested labs within last  8760 hours.   Recent Lipid Panel No results found for: CHOL, TRIG, HDL, CHOLHDL, LDLCALC, LDLDIRECT  Wt Readings from Last 3 Encounters:  10/15/19 211 lb 9.6 oz (96 kg)  08/07/18 204 lb (92.5 kg)  07/11/17 195 lb (88.5 kg)     Objective:    Vital Signs:  BP (!) 148/90   Pulse 75   Ht 6\' 2"  (1.88 m)   Wt 211 lb 9.6 oz (96 kg)   BMI 27.17 kg/m    Affect appropriate Healthy:  appears stated age HEENT: normal Neck supple with no adenopathy JVP normal no bruits no thyromegaly Lungs clear with no wheezing and good diaphragmatic motion Heart:  S1/S2 no murmur, no rub, gallop or click PMI normal Abdomen: benighn, BS positve, no tenderness, no AAA no bruit.  No HSM or HJR Distal pulses intact with no bruits No edema Neuro non-focal Skin warm and dry No muscular weakness    ASSESSMENT & PLAN:    PVC;s: asymptomatic take mag-oxide with diuretic no structural heart disease by echo in 2015 will update   HTN: improved on lasix now  Low T: continue replacement  Gout: Low red meet and less ETOH f/u Ross consider uloric   Edema:  Dependant lasix as needed compression stockings on motorcycle   Subdural Hematoma: traumatic improved on latest CT f/u neuro   ENT:  Chronic rhinitis f/u Dr Lucia Gaskins Nasacort and clindamycin RX   COVID-19 Education: The signs and symptoms of COVID-19 were discussed with the patient and how to seek care for testing (follow up with PCP or arrange E-visit).  The importance of social distancing was discussed today   Medication Adjustments/Labs and Tests Ordered: Current medicines are reviewed at length with the patient today.  Concerns regarding medicines are outlined above.   Tests Ordered:  Echo for PVCls   Medication Changes:  None  Disposition:  Follow up in a year  Signed, Jenkins Rouge, MD  10/15/2019 9:22 AM    [place]

## 2019-10-10 DIAGNOSIS — M9904 Segmental and somatic dysfunction of sacral region: Secondary | ICD-10-CM | POA: Diagnosis not present

## 2019-10-10 DIAGNOSIS — M5137 Other intervertebral disc degeneration, lumbosacral region: Secondary | ICD-10-CM | POA: Diagnosis not present

## 2019-10-10 DIAGNOSIS — M5136 Other intervertebral disc degeneration, lumbar region: Secondary | ICD-10-CM | POA: Diagnosis not present

## 2019-10-10 DIAGNOSIS — M5135 Other intervertebral disc degeneration, thoracolumbar region: Secondary | ICD-10-CM | POA: Diagnosis not present

## 2019-10-10 DIAGNOSIS — M9902 Segmental and somatic dysfunction of thoracic region: Secondary | ICD-10-CM | POA: Diagnosis not present

## 2019-10-10 DIAGNOSIS — M9903 Segmental and somatic dysfunction of lumbar region: Secondary | ICD-10-CM | POA: Diagnosis not present

## 2019-10-15 ENCOUNTER — Ambulatory Visit: Payer: PPO | Admitting: Cardiovascular Disease

## 2019-10-15 ENCOUNTER — Other Ambulatory Visit: Payer: Self-pay

## 2019-10-15 ENCOUNTER — Encounter: Payer: Self-pay | Admitting: Cardiovascular Disease

## 2019-10-15 VITALS — BP 148/90 | HR 75 | Ht 74.0 in | Wt 211.6 lb

## 2019-10-15 DIAGNOSIS — I493 Ventricular premature depolarization: Secondary | ICD-10-CM | POA: Diagnosis not present

## 2019-10-15 DIAGNOSIS — I10 Essential (primary) hypertension: Secondary | ICD-10-CM | POA: Diagnosis not present

## 2019-10-15 NOTE — Patient Instructions (Signed)
Medication Instructions:  *If you need a refill on your cardiac medications before your next appointment, please call your pharmacy*  Lab Work: If you have labs (blood work) drawn today and your tests are completely normal, you will receive your results only by: . MyChart Message (if you have MyChart) OR . A paper copy in the mail If you have any lab test that is abnormal or we need to change your treatment, we will call you to review the results.  Testing/Procedures: Your physician has requested that you have an echocardiogram. Echocardiography is a painless test that uses sound waves to create images of your heart. It provides your doctor with information about the size and shape of your heart and how well your heart's chambers and valves are working. This procedure takes approximately one hour. There are no restrictions for this procedure.  Follow-Up: At [facility], you and your health needs are our priority.  As part of our continuing mission to provide you with exceptional heart care, we have created designated Provider Care Teams.  These Care Teams include your primary Cardiologist (physician) and Advanced Practice Providers (APPs -  Physician Assistants and Nurse Practitioners) who all work together to provide you with the care you need, when you need it.  We recommend signing up for the patient portal called "MyChart".  Sign up information is provided on this After Visit Summary.  MyChart is used to connect with patients for Virtual Visits (Telemedicine).  Patients are able to view lab/test results, encounter notes, upcoming appointments, etc.  Non-urgent messages can be sent to your provider as well.   To learn more about what you can do with MyChart, go to [email or web address].    Your next appointment:   1 year(s)  The format for your next appointment:   In Person  Provider:   You may see Peter Nishan, MD or one of the following Advanced Practice Providers on your  designated Care Team:    Lori Gerhardt, NP  Laura Ingold, NP  Jill McDaniel, NP    

## 2019-10-17 DIAGNOSIS — M5136 Other intervertebral disc degeneration, lumbar region: Secondary | ICD-10-CM | POA: Diagnosis not present

## 2019-10-17 DIAGNOSIS — M5135 Other intervertebral disc degeneration, thoracolumbar region: Secondary | ICD-10-CM | POA: Diagnosis not present

## 2019-10-17 DIAGNOSIS — M9904 Segmental and somatic dysfunction of sacral region: Secondary | ICD-10-CM | POA: Diagnosis not present

## 2019-10-17 DIAGNOSIS — M9902 Segmental and somatic dysfunction of thoracic region: Secondary | ICD-10-CM | POA: Diagnosis not present

## 2019-10-17 DIAGNOSIS — M9903 Segmental and somatic dysfunction of lumbar region: Secondary | ICD-10-CM | POA: Diagnosis not present

## 2019-10-17 DIAGNOSIS — M5137 Other intervertebral disc degeneration, lumbosacral region: Secondary | ICD-10-CM | POA: Diagnosis not present

## 2019-10-22 DIAGNOSIS — E291 Testicular hypofunction: Secondary | ICD-10-CM | POA: Diagnosis not present

## 2019-10-23 DIAGNOSIS — M5136 Other intervertebral disc degeneration, lumbar region: Secondary | ICD-10-CM | POA: Diagnosis not present

## 2019-10-23 DIAGNOSIS — M9902 Segmental and somatic dysfunction of thoracic region: Secondary | ICD-10-CM | POA: Diagnosis not present

## 2019-10-23 DIAGNOSIS — M9904 Segmental and somatic dysfunction of sacral region: Secondary | ICD-10-CM | POA: Diagnosis not present

## 2019-10-23 DIAGNOSIS — M5135 Other intervertebral disc degeneration, thoracolumbar region: Secondary | ICD-10-CM | POA: Diagnosis not present

## 2019-10-23 DIAGNOSIS — M5137 Other intervertebral disc degeneration, lumbosacral region: Secondary | ICD-10-CM | POA: Diagnosis not present

## 2019-10-23 DIAGNOSIS — M9903 Segmental and somatic dysfunction of lumbar region: Secondary | ICD-10-CM | POA: Diagnosis not present

## 2019-10-24 ENCOUNTER — Ambulatory Visit (HOSPITAL_COMMUNITY): Payer: PPO | Attending: Cardiology

## 2019-10-24 ENCOUNTER — Telehealth: Payer: Self-pay

## 2019-10-24 ENCOUNTER — Other Ambulatory Visit: Payer: Self-pay

## 2019-10-24 DIAGNOSIS — I493 Ventricular premature depolarization: Secondary | ICD-10-CM | POA: Insufficient documentation

## 2019-10-24 DIAGNOSIS — I10 Essential (primary) hypertension: Secondary | ICD-10-CM | POA: Insufficient documentation

## 2019-10-24 LAB — ECHOCARDIOGRAM COMPLETE
Area-P 1/2: 2.91 cm2
S' Lateral: 3.1 cm

## 2019-10-24 MED ORDER — METOPROLOL TARTRATE 25 MG PO TABS: 25.0000 mg | ORAL_TABLET | Freq: Two times a day (BID) | ORAL | 3 refills | Status: DC

## 2019-10-24 NOTE — Telephone Encounter (Signed)
Called patient back with Dr. Kyla Balzarine recommendations. Will send Lopressor 25 mg by mouth BID to patient's pharmacy of choice. Patient verbalized understanding.

## 2019-10-24 NOTE — Telephone Encounter (Signed)
-----   Message from Josue Hector, MD sent at 10/24/2019  3:06 PM EDT ----- No exercise limits can start lopressor 25 mg bid in addition to his ARB for BP ----- Message ----- From: Michaelyn Barter, RN Sent: 10/24/2019   2:37 PM EDT To: Josue Hector, MD  Called patient about his echo results. Patient having aortic aneurysm/dilatation is new to him and he had a lot of questions about the results. Answered patient's questions to the best of my ability. Some questions will refer to [name]. Patient is really concerned. Made patient an appointment to discuss results when he gets back from his vacation.  In the mean time, patient wanted to know any activity perimeters he should have. Patient does a lot of exercising. Informed patient to make sure he keeps his BP under control. Here are his most recent readings 143/81 135/78 138/85 134/83 131/87 127/81. Patient was wondering if he needed to increase his BP medication. Will forward to [name] for advisement.

## 2019-10-29 DIAGNOSIS — M65342 Trigger finger, left ring finger: Secondary | ICD-10-CM | POA: Diagnosis not present

## 2019-10-29 DIAGNOSIS — M65331 Trigger finger, right middle finger: Secondary | ICD-10-CM | POA: Diagnosis not present

## 2019-10-29 DIAGNOSIS — M65332 Trigger finger, left middle finger: Secondary | ICD-10-CM | POA: Diagnosis not present

## 2019-11-03 DIAGNOSIS — E291 Testicular hypofunction: Secondary | ICD-10-CM | POA: Diagnosis not present

## 2019-11-04 DIAGNOSIS — M9903 Segmental and somatic dysfunction of lumbar region: Secondary | ICD-10-CM | POA: Diagnosis not present

## 2019-11-04 DIAGNOSIS — M9902 Segmental and somatic dysfunction of thoracic region: Secondary | ICD-10-CM | POA: Diagnosis not present

## 2019-11-04 DIAGNOSIS — M5136 Other intervertebral disc degeneration, lumbar region: Secondary | ICD-10-CM | POA: Diagnosis not present

## 2019-11-04 DIAGNOSIS — M9904 Segmental and somatic dysfunction of sacral region: Secondary | ICD-10-CM | POA: Diagnosis not present

## 2019-11-04 DIAGNOSIS — M5137 Other intervertebral disc degeneration, lumbosacral region: Secondary | ICD-10-CM | POA: Diagnosis not present

## 2019-11-04 DIAGNOSIS — M5135 Other intervertebral disc degeneration, thoracolumbar region: Secondary | ICD-10-CM | POA: Diagnosis not present

## 2019-11-06 NOTE — Progress Notes (Deleted)
Date:  11/06/2019   ID:  Alexander Rea., DOB 05-24-1945, MRN [id number]  PCP:  Lawerance Cruel, MD  Cardiologist:   Johnsie Cancel Electrophysiologist:  None   Evaluation Performed:  Follow-Up Visit  Chief Complaint:  PVC/HTN   History of Present Illness:    74 y.o.  previously seen by Dr Marlou Porch.  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  Chronic HTN on Rx White coat component Runs good at home  Denies excess NSAI's , ETOH  Compliant with meds.  Does not note PVCls no palpitations or syncope.  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  Works out pretty regularly with no chest pain    F/U echo :  Reviewed  05/08/13  Normal  Study Conclusions  Left ventricle: The cavity size was normal. Systolic function was normal. The estimated ejection fraction was in the range of 60% to 65%. Wall motion was normal; there were no regional wall motion abnormalities.  ETT 05/08/13  Normal exercised 10 mintues   Avoids diuretic excess due to gout   Had a subdural hematoma during skiing accident in March 2019 with improvement And no mass effect on last CT done 04/30/17  Some dependant edema on motorcycle helped by lasix Has trainer 3x/week more active   Seen by ENT June 2021 for chronic rhinitis Dr Lucia Gaskins   Has had vaccine Son in [place] with Child psychotherapist roofing and on in [place]  He is still doing Nutritional therapist   Some white coat HTN Compliant with meds and BP fine at home   Updated echo 10/24/19 EF 60-65%  No valve disease Aortic root measured 4.1 cm  ***  The patient  does not have symptoms concerning for COVID-19 infection (fever, chills, cough, or new shortness of breath).    Past Medical History:  Diagnosis Date  . Adrenal insufficiency ([place])   . Cataract   . Dysplastic nevus    [name]  . HTN (hypertension)    Benign  . Hypercholesterolemia   . Low testosterone   . Lower back pain   . PVC's (premature ventricular  contractions)    STRESS TEST IN [facility]   Past Surgical History:  Procedure Laterality Date  . CATARACT EXTRACTION, BILATERAL  2013   Bil  . RETINAL DETACHMENT SURGERY     RIGHT EYE  . SHOULDER SURGERY  2008   Left, right     No outpatient medications have been marked as taking for the 11/13/19 encounter (Appointment) with Josue Hector, MD.     Allergies:   Amlodipine besylate, Ciprofloxacin, Clomiphene, Irbesartan, Oseltamivir, Other, and Penicillins   Social History   Tobacco Use  . Smoking status: Never Smoker  . Smokeless tobacco: Never Used  Substance Use Topics  . Alcohol use: Yes    Alcohol/week: 7.0 standard drinks    Types: 7 Standard drinks or equivalent per week  . Drug use: No     Family Hx: The patient's family history includes Atrial fibrillation in his mother; Heart disease in his sister; Ovarian cancer in his mother.  ROS:   Please see the history of present illness.     All other systems reviewed and are negative.   Prior CV studies:   The following studies were reviewed today:  Echo :  05/08/13   Labs/Other Tests and Data Reviewed:    EKG:   SR normal rate 75  Recent Labs: No results found for requested labs within  last 8760 hours.   Recent Lipid Panel No results found for: CHOL, TRIG, HDL, CHOLHDL, LDLCALC, LDLDIRECT  Wt Readings from Last 3 Encounters:  10/15/19 211 lb 9.6 oz (96 kg)  08/07/18 204 lb (92.5 kg)  07/11/17 195 lb (88.5 kg)     Objective:    Vital Signs:  There were no vitals taken for this visit.   Affect appropriate Healthy:  appears stated age 89: normal Neck supple with no adenopathy JVP normal no bruits no thyromegaly Lungs clear with no wheezing and good diaphragmatic motion Heart:  S1/S2 no murmur, no rub, gallop or click PMI normal Abdomen: benighn, BS positve, no tenderness, no AAA no bruit.  No HSM or HJR Distal pulses intact with no bruits No edema Neuro non-focal Skin warm and dry No muscular  weakness    ASSESSMENT & PLAN:    PVC;s: asymptomatic take mag-oxide with diuretic no structural heart disease by echo in 10/24/19  HTN: improved on lasix now  Low T: continue replacement  Gout: Low red meet and less ETOH f/u Ross consider uloric   Edema:  Dependant lasix as needed compression stockings on motorcycle   Subdural Hematoma: traumatic improved on latest CT f/u neuro   ENT:  Chronic rhinitis f/u Dr Lucia Gaskins Nasacort and clindamycin RX   Aneurysm:  Aortic 4.1 cm on echo f/u CTA in a year   COVID-19 Education: The signs and symptoms of COVID-19 were discussed with the patient and how to seek care for testing (follow up with PCP or arrange E-visit).  The importance of social distancing was discussed today   Medication Adjustments/Labs and Tests Ordered: Current medicines are reviewed at length with the patient today.  Concerns regarding medicines are outlined above.   Tests Ordered:  CTA aorta in a year   Medication Changes:  None  Disposition:  Follow up in a year  Signed, Jenkins Rouge, MD  11/06/2019 9:18 AM    [place]

## 2019-11-12 ENCOUNTER — Telehealth: Payer: Self-pay | Admitting: Cardiovascular Disease

## 2019-11-12 NOTE — Telephone Encounter (Signed)
Patient aware of Dr. Kyla Balzarine response.

## 2019-11-12 NOTE — Telephone Encounter (Signed)
Patient requesting to have BP readings available for [name] during virtual appointment on 11/14/19. He states he began taking beta-blocker once daily, around lunch. He states he is aware that [name] advised that he takes the medication twice daily. However, he wanted to gradually work up to taking medication it twice. BP readings begin the day after patient began taking the medication and are as follows:  10/26/19- BP: 130/72 HR:57      BP: 122/71 HR:58      BP: 120/72 HR:55 10/27/19- BP: 130/82 HR: 63      BP: 137/77 HR: 63       BP: 125/73  HR: 61      BP: 124/75 HR: 61      BP: 123/69 HR: 64 10/28/19- BP: 119/68 HR: 72      BP: 122/75 HR: 68 10/29/19- BP: 123/74 HR: 76      BP: 112/64  HR: 75 10/30/19- BP: 120/69 HR: 81      BP: 126/68 HR: 82      BP: 123/67 HR: 80 10/31/19- BP: 125/68 HR:63 11/01/19- BP: 134/78 HR:53      BP: 137/76 HR: 54      BP: 129/71 HR: 53 11/03/19- BP: 120/73 HR: 57      BP: 113/69 HR:54 11/04/19- BP: 138/81 HR: 60       BP: 121/81 HR: 60      BP: 135/79 HR: 59      BP: 132/77 HR:61      BP: 117/67  (patient at [facility] until 11/10/19)  11/10/19- BP: 125/77 HR: 54      BP: 126/73 HR:53 11/12/19- BP: 130/80      BP: 125/81 HR: 65      BP: 135/78 HR: 64      BP: 124/77 HR: 60      BP: 115/76  HR: 60

## 2019-11-12 NOTE — Telephone Encounter (Signed)
Those readings look pretty good on once daily beta blocker

## 2019-11-13 ENCOUNTER — Ambulatory Visit: Payer: PPO | Admitting: Cardiovascular Disease

## 2019-11-13 ENCOUNTER — Telehealth: Payer: Self-pay

## 2019-11-13 NOTE — Progress Notes (Signed)
Virtual Visit via Video Note   This visit type was conducted due to national recommendations for restrictions regarding the COVID-19 Pandemic (e.g. social distancing) in an effort to limit this patient's exposure and mitigate transmission in our community.  Due to her co-morbid illnesses, this patient is at least at moderate risk for complications without adequate follow up.  This format is felt to be most appropriate for this patient at this time.  All issues noted in this document were discussed and addressed.  A limited physical exam was performed with this format.  Please refer to the patient's chart for her consent to telehealth for [facility].   Patient Location: Home Physician Location: Office  Date:  11/13/2019   ID:  Alexander Rea., DOB [birth year], MRN [id number]  PCP:  Lawerance Cruel, MD  Cardiologist:   Johnsie Cancel  Evaluation Performed:  Follow-Up Visit  Chief Complaint:  PVC/HTN   History of Present Illness:    74 y.o.  previously seen by Dr Marlou Porch.  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  Chronic HTN on Rx White coat component Runs good at home  Denies excess NSAI's , ETOH  Compliant with meds.  Does not note PVCls no palpitations or syncope.  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  Works out pretty regularly with no chest pain    F/U echo :  Reviewed  05/08/13  Normal  Study Conclusions  Left ventricle: The cavity size was normal. Systolic function was normal. The estimated ejection fraction was in the range of 60% to 65%. Wall motion was normal; there were no regional wall motion abnormalities.  ETT 05/08/13  Normal exercised 10 mintues   Avoids diuretic excess due to gout   Had a subdural hematoma during skiing accident in March 2019 with improvement And no mass effect on last CT done 04/30/17  Some dependant edema on motorcycle helped by lasix Has trainer 3x/week more active   Seen by ENT June 2021 for  chronic rhinitis Dr Lucia Gaskins   Has had vaccine Son in [place] with Child psychotherapist roofing and on in [place]  He is still doing Nutritional therapist   Some white coat HTN Compliant with meds and BP fine at home   Updated echo 10/24/19 EF 60-65%  No valve disease Aortic root measured 4.1 cm  Discussed having CTA in a year   He was exposed to [place] at [place] week and is quarantining He has been vaccinated but with Moderna so no booster available       Past Medical History:  Diagnosis Date  . Adrenal insufficiency ([place])   . Cataract   . Dysplastic nevus    [name]  . HTN (hypertension)    Benign  . Hypercholesterolemia   . Low testosterone   . Lower back pain   . PVC's (premature ventricular contractions)    STRESS TEST IN [facility]   Past Surgical History:  Procedure Laterality Date  . CATARACT EXTRACTION, BILATERAL  2013   Bil  . RETINAL DETACHMENT SURGERY     RIGHT EYE  . SHOULDER SURGERY  2008   Left, right     No outpatient medications have been marked as taking for the 11/14/19 encounter (Appointment) with Josue Hector, MD.     Allergies:   Amlodipine besylate, Ciprofloxacin, Clomiphene, Irbesartan, Oseltamivir, Other, and Penicillins   Social History   Tobacco Use  . Smoking status: Never Smoker  . Smokeless  tobacco: Never Used  Substance Use Topics  . Alcohol use: Yes    Alcohol/week: 7.0 standard drinks    Types: 7 Standard drinks or equivalent per week  . Drug use: No     Family Hx: The patient's family history includes Atrial fibrillation in his mother; Heart disease in his sister; Ovarian cancer in his mother.  ROS:   Please see the history of present illness.     All other systems reviewed and are negative.   Prior CV studies:   The following studies were reviewed today:  Echo :  05/08/13   Labs/Other Tests and Data Reviewed:    EKG:   SR normal rate 75  Recent Labs: No results found for requested labs within last 8760 hours.   Recent  Lipid Panel No results found for: CHOL, TRIG, HDL, CHOLHDL, LDLCALC, LDLDIRECT  Wt Readings from Last 3 Encounters:  10/15/19 211 lb 9.6 oz (96 kg)  08/07/18 204 lb (92.5 kg)  07/11/17 195 lb (88.5 kg)     Objective:    Vital Signs:  There were no vitals taken for this visit.   Telephone no exam    ASSESSMENT & PLAN:    PVC;s: asymptomatic take mag-oxide with diuretic no structural heart disease by echo in 10/24/19  HTN: improved on lasix now  Low T: continue replacement  Gout: Low red meet and less ETOH f/u Ross consider uloric   Edema:  Dependant lasix as needed compression stockings on motorcycle   Subdural Hematoma: traumatic improved on latest CT f/u neuro   ENT:  Chronic rhinitis f/u Dr Lucia Gaskins Nasacort and clindamycin RX   Aneurysm:  Aortic 4.1 cm on echo f/u CTA in a year   COVID-19 Education: The signs and symptoms of COVID-19 were discussed with the patient and how to seek care for testing (follow up with PCP or arrange E-visit).  The importance of social distancing was discussed today   Medication Adjustments/Labs and Tests Ordered: Current medicines are reviewed at length with the patient today.  Concerns regarding medicines are outlined above.   Tests Ordered:  CTA aorta in a year   Medication Changes:  None  Disposition:  Follow up in a year  Time: spent reviewing chart monitor labs echo direct patient interview and composing notes 20 minutes   Signed, Jenkins Rouge, MD  11/13/2019 4:53 PM    [place]

## 2019-11-13 NOTE — Telephone Encounter (Signed)
Called patient to let him know what to expect from his virtual visit tomorrow. Left message for patient to call back.

## 2019-11-14 ENCOUNTER — Telehealth (INDEPENDENT_AMBULATORY_CARE_PROVIDER_SITE_OTHER): Payer: PPO | Admitting: Cardiovascular Disease

## 2019-11-14 ENCOUNTER — Encounter: Payer: Self-pay | Admitting: Cardiovascular Disease

## 2019-11-14 ENCOUNTER — Other Ambulatory Visit: Payer: Self-pay

## 2019-11-14 VITALS — BP 115/64 | HR 63 | Wt 205.0 lb

## 2019-11-14 DIAGNOSIS — I340 Nonrheumatic mitral (valve) insufficiency: Secondary | ICD-10-CM | POA: Diagnosis not present

## 2019-11-14 DIAGNOSIS — I493 Ventricular premature depolarization: Secondary | ICD-10-CM | POA: Diagnosis not present

## 2019-11-14 DIAGNOSIS — I10 Essential (primary) hypertension: Secondary | ICD-10-CM

## 2019-11-14 NOTE — Patient Instructions (Signed)
Medication Instructions:  No changes *If you need a refill on your cardiac medications before your next appointment, please call your pharmacy*   Lab Work: none If you have labs (blood work) drawn today and your tests are completely normal, you will receive your results only by: Marland Kitchen MyChart Message (if you have MyChart) OR . A paper copy in the mail If you have any lab test that is abnormal or we need to change your treatment, we will call you to review the results.   Testing/Procedures: none   Follow-Up: At [facility], you and your health needs are our priority.  As part of our continuing mission to provide you with exceptional heart care, we have created designated Provider Care Teams.  These Care Teams include your primary Cardiologist (physician) and Advanced Practice Providers (APPs -  Physician Assistants and Nurse Practitioners) who all work together to provide you with the care you need, when you need it.  We recommend signing up for the patient portal called "MyChart".  Sign up information is provided on this After Visit Summary.  MyChart is used to connect with patients for Virtual Visits (Telemedicine).  Patients are able to view lab/test results, encounter notes, upcoming appointments, etc.  Non-urgent messages can be sent to your provider as well.   To learn more about what you can do with MyChart, go to [email or web address].    Your next appointment:   12 month(s)  The format for your next appointment:   In Person  Provider:   You may see Jenkins Rouge, MD or one of the following Advanced Practice Providers on your designated Care Team:    Truitt Merle, NP  Cecilie Kicks, NP  Kathyrn Drown, NP    Other Instructions

## 2019-11-14 NOTE — Telephone Encounter (Signed)
  Patient Consent for Virtual Visit         Alexander Niblack. has provided verbal consent on 11/14/2019 for a virtual visit (video or telephone).   CONSENT FOR VIRTUAL VISIT FOR:  Alexander Anderson.  By participating in this virtual visit I agree to the following:  I hereby voluntarily request, consent and authorize [facility] and its employed or contracted physicians, physician assistants, nurse practitioners or other licensed health care professionals (the Practitioner), to provide me with telemedicine health care services (the "Services") as deemed necessary by the treating Practitioner. I acknowledge and consent to receive the Services by the Practitioner via telemedicine. I understand that the telemedicine visit will involve communicating with the Practitioner through live audiovisual communication technology and the disclosure of certain medical information by electronic transmission. I acknowledge that I have been given the opportunity to request an in-person assessment or other available alternative prior to the telemedicine visit and am voluntarily participating in the telemedicine visit.  I understand that I have the right to withhold or withdraw my consent to the use of telemedicine in the course of my care at any time, without affecting my right to future care or treatment, and that the Practitioner or I may terminate the telemedicine visit at any time. I understand that I have the right to inspect all information obtained and/or recorded in the course of the telemedicine visit and may receive copies of available information for a reasonable fee.  I understand that some of the potential risks of receiving the Services via telemedicine include:  Marland Kitchen Delay or interruption in medical evaluation due to technological equipment failure or disruption; . Information transmitted may not be sufficient (e.g. poor resolution of images) to allow for appropriate medical decision making by the  Practitioner; and/or  . In rare instances, security protocols could fail, causing a breach of personal health information.  Furthermore, I acknowledge that it is my responsibility to provide information about my medical history, conditions and care that is complete and accurate to the best of my ability. I acknowledge that Practitioner's advice, recommendations, and/or decision may be based on factors not within their control, such as incomplete or inaccurate data provided by me or distortions of diagnostic images or specimens that may result from electronic transmissions. I understand that the practice of medicine is not an exact science and that Practitioner makes no warranties or guarantees regarding treatment outcomes. I acknowledge that a copy of this consent can be made available to me via my patient portal ([facility]), or I can request a printed copy by calling the office of [facility].    I understand that my insurance will be billed for this visit.   I have read or had this consent read to me. . I understand the contents of this consent, which adequately explains the benefits and risks of the Services being provided via telemedicine.  . I have been provided ample opportunity to ask questions regarding this consent and the Services and have had my questions answered to my satisfaction. . I give my informed consent for the services to be provided through the use of telemedicine in my medical care

## 2019-11-19 DIAGNOSIS — E291 Testicular hypofunction: Secondary | ICD-10-CM | POA: Diagnosis not present

## 2019-11-20 DIAGNOSIS — M5136 Other intervertebral disc degeneration, lumbar region: Secondary | ICD-10-CM | POA: Diagnosis not present

## 2019-11-20 DIAGNOSIS — M9903 Segmental and somatic dysfunction of lumbar region: Secondary | ICD-10-CM | POA: Diagnosis not present

## 2019-11-20 DIAGNOSIS — M5137 Other intervertebral disc degeneration, lumbosacral region: Secondary | ICD-10-CM | POA: Diagnosis not present

## 2019-11-20 DIAGNOSIS — M5135 Other intervertebral disc degeneration, thoracolumbar region: Secondary | ICD-10-CM | POA: Diagnosis not present

## 2019-11-20 DIAGNOSIS — M9904 Segmental and somatic dysfunction of sacral region: Secondary | ICD-10-CM | POA: Diagnosis not present

## 2019-11-20 DIAGNOSIS — M9902 Segmental and somatic dysfunction of thoracic region: Secondary | ICD-10-CM | POA: Diagnosis not present

## 2019-11-27 DIAGNOSIS — M9904 Segmental and somatic dysfunction of sacral region: Secondary | ICD-10-CM | POA: Diagnosis not present

## 2019-11-27 DIAGNOSIS — M9903 Segmental and somatic dysfunction of lumbar region: Secondary | ICD-10-CM | POA: Diagnosis not present

## 2019-11-27 DIAGNOSIS — M5136 Other intervertebral disc degeneration, lumbar region: Secondary | ICD-10-CM | POA: Diagnosis not present

## 2019-11-27 DIAGNOSIS — M5135 Other intervertebral disc degeneration, thoracolumbar region: Secondary | ICD-10-CM | POA: Diagnosis not present

## 2019-11-27 DIAGNOSIS — M9902 Segmental and somatic dysfunction of thoracic region: Secondary | ICD-10-CM | POA: Diagnosis not present

## 2019-11-27 DIAGNOSIS — M5137 Other intervertebral disc degeneration, lumbosacral region: Secondary | ICD-10-CM | POA: Diagnosis not present

## 2019-12-02 DIAGNOSIS — E291 Testicular hypofunction: Secondary | ICD-10-CM | POA: Diagnosis not present

## 2019-12-05 ENCOUNTER — Ambulatory Visit: Payer: PPO

## 2019-12-10 DIAGNOSIS — M5137 Other intervertebral disc degeneration, lumbosacral region: Secondary | ICD-10-CM | POA: Diagnosis not present

## 2019-12-10 DIAGNOSIS — M5135 Other intervertebral disc degeneration, thoracolumbar region: Secondary | ICD-10-CM | POA: Diagnosis not present

## 2019-12-10 DIAGNOSIS — M9904 Segmental and somatic dysfunction of sacral region: Secondary | ICD-10-CM | POA: Diagnosis not present

## 2019-12-10 DIAGNOSIS — M9903 Segmental and somatic dysfunction of lumbar region: Secondary | ICD-10-CM | POA: Diagnosis not present

## 2019-12-10 DIAGNOSIS — M9902 Segmental and somatic dysfunction of thoracic region: Secondary | ICD-10-CM | POA: Diagnosis not present

## 2019-12-10 DIAGNOSIS — M5136 Other intervertebral disc degeneration, lumbar region: Secondary | ICD-10-CM | POA: Diagnosis not present

## 2019-12-18 DIAGNOSIS — E291 Testicular hypofunction: Secondary | ICD-10-CM | POA: Diagnosis not present

## 2019-12-19 DIAGNOSIS — M9902 Segmental and somatic dysfunction of thoracic region: Secondary | ICD-10-CM | POA: Diagnosis not present

## 2019-12-19 DIAGNOSIS — M5135 Other intervertebral disc degeneration, thoracolumbar region: Secondary | ICD-10-CM | POA: Diagnosis not present

## 2019-12-19 DIAGNOSIS — M9903 Segmental and somatic dysfunction of lumbar region: Secondary | ICD-10-CM | POA: Diagnosis not present

## 2019-12-19 DIAGNOSIS — M5136 Other intervertebral disc degeneration, lumbar region: Secondary | ICD-10-CM | POA: Diagnosis not present

## 2019-12-19 DIAGNOSIS — M9904 Segmental and somatic dysfunction of sacral region: Secondary | ICD-10-CM | POA: Diagnosis not present

## 2019-12-19 DIAGNOSIS — M5137 Other intervertebral disc degeneration, lumbosacral region: Secondary | ICD-10-CM | POA: Diagnosis not present

## 2019-12-24 DIAGNOSIS — D2271 Melanocytic nevi of right lower limb, including hip: Secondary | ICD-10-CM | POA: Diagnosis not present

## 2019-12-24 DIAGNOSIS — D2272 Melanocytic nevi of left lower limb, including hip: Secondary | ICD-10-CM | POA: Diagnosis not present

## 2019-12-24 DIAGNOSIS — L812 Freckles: Secondary | ICD-10-CM | POA: Diagnosis not present

## 2019-12-24 DIAGNOSIS — L821 Other seborrheic keratosis: Secondary | ICD-10-CM | POA: Diagnosis not present

## 2019-12-24 DIAGNOSIS — L738 Other specified follicular disorders: Secondary | ICD-10-CM | POA: Diagnosis not present

## 2019-12-24 DIAGNOSIS — Z85820 Personal history of malignant melanoma of skin: Secondary | ICD-10-CM | POA: Diagnosis not present

## 2019-12-24 DIAGNOSIS — L720 Epidermal cyst: Secondary | ICD-10-CM | POA: Diagnosis not present

## 2019-12-26 DIAGNOSIS — M4726 Other spondylosis with radiculopathy, lumbar region: Secondary | ICD-10-CM | POA: Diagnosis not present

## 2019-12-26 DIAGNOSIS — I10 Essential (primary) hypertension: Secondary | ICD-10-CM | POA: Diagnosis not present

## 2019-12-26 DIAGNOSIS — E7800 Pure hypercholesterolemia, unspecified: Secondary | ICD-10-CM | POA: Diagnosis not present

## 2020-01-05 DIAGNOSIS — E291 Testicular hypofunction: Secondary | ICD-10-CM | POA: Diagnosis not present

## 2020-01-06 DIAGNOSIS — M4726 Other spondylosis with radiculopathy, lumbar region: Secondary | ICD-10-CM | POA: Diagnosis not present

## 2020-01-09 DIAGNOSIS — M4726 Other spondylosis with radiculopathy, lumbar region: Secondary | ICD-10-CM | POA: Diagnosis not present

## 2020-01-12 DIAGNOSIS — M4726 Other spondylosis with radiculopathy, lumbar region: Secondary | ICD-10-CM | POA: Diagnosis not present

## 2020-01-13 DIAGNOSIS — I10 Essential (primary) hypertension: Secondary | ICD-10-CM | POA: Diagnosis not present

## 2020-01-13 DIAGNOSIS — M5412 Radiculopathy, cervical region: Secondary | ICD-10-CM | POA: Diagnosis not present

## 2020-01-13 DIAGNOSIS — E291 Testicular hypofunction: Secondary | ICD-10-CM | POA: Diagnosis not present

## 2020-01-13 DIAGNOSIS — Z79899 Other long term (current) drug therapy: Secondary | ICD-10-CM | POA: Diagnosis not present

## 2020-01-15 DIAGNOSIS — M4726 Other spondylosis with radiculopathy, lumbar region: Secondary | ICD-10-CM | POA: Diagnosis not present

## 2020-01-19 DIAGNOSIS — M4726 Other spondylosis with radiculopathy, lumbar region: Secondary | ICD-10-CM | POA: Diagnosis not present

## 2020-01-19 DIAGNOSIS — E291 Testicular hypofunction: Secondary | ICD-10-CM | POA: Diagnosis not present

## 2020-01-23 DIAGNOSIS — M4726 Other spondylosis with radiculopathy, lumbar region: Secondary | ICD-10-CM | POA: Diagnosis not present

## 2020-01-23 DIAGNOSIS — M4306 Spondylolysis, lumbar region: Secondary | ICD-10-CM | POA: Diagnosis not present

## 2020-01-23 DIAGNOSIS — M5416 Radiculopathy, lumbar region: Secondary | ICD-10-CM | POA: Diagnosis not present

## 2020-01-27 DIAGNOSIS — M4726 Other spondylosis with radiculopathy, lumbar region: Secondary | ICD-10-CM | POA: Diagnosis not present

## 2020-02-03 DIAGNOSIS — M545 Low back pain, unspecified: Secondary | ICD-10-CM | POA: Diagnosis not present

## 2020-02-03 DIAGNOSIS — E291 Testicular hypofunction: Secondary | ICD-10-CM | POA: Diagnosis not present

## 2020-02-06 DIAGNOSIS — H16141 Punctate keratitis, right eye: Secondary | ICD-10-CM | POA: Diagnosis not present

## 2020-02-06 DIAGNOSIS — S0501XA Injury of conjunctiva and corneal abrasion without foreign body, right eye, initial encounter: Secondary | ICD-10-CM | POA: Diagnosis not present

## 2020-02-17 DIAGNOSIS — E291 Testicular hypofunction: Secondary | ICD-10-CM | POA: Diagnosis not present

## 2020-02-17 DIAGNOSIS — H26491 Other secondary cataract, right eye: Secondary | ICD-10-CM | POA: Diagnosis not present

## 2020-02-18 DIAGNOSIS — M47816 Spondylosis without myelopathy or radiculopathy, lumbar region: Secondary | ICD-10-CM | POA: Diagnosis not present

## 2020-02-24 DIAGNOSIS — H02834 Dermatochalasis of left upper eyelid: Secondary | ICD-10-CM | POA: Diagnosis not present

## 2020-02-24 DIAGNOSIS — H18413 Arcus senilis, bilateral: Secondary | ICD-10-CM | POA: Diagnosis not present

## 2020-02-24 DIAGNOSIS — H26491 Other secondary cataract, right eye: Secondary | ICD-10-CM | POA: Diagnosis not present

## 2020-02-24 DIAGNOSIS — Z961 Presence of intraocular lens: Secondary | ICD-10-CM | POA: Diagnosis not present

## 2020-03-03 DIAGNOSIS — E291 Testicular hypofunction: Secondary | ICD-10-CM | POA: Diagnosis not present

## 2020-03-08 DIAGNOSIS — I10 Essential (primary) hypertension: Secondary | ICD-10-CM | POA: Diagnosis not present

## 2020-03-08 DIAGNOSIS — E7800 Pure hypercholesterolemia, unspecified: Secondary | ICD-10-CM | POA: Diagnosis not present

## 2020-03-10 DIAGNOSIS — H02413 Mechanical ptosis of bilateral eyelids: Secondary | ICD-10-CM | POA: Diagnosis not present

## 2020-03-10 DIAGNOSIS — D23112 Other benign neoplasm of skin of right lower eyelid, including canthus: Secondary | ICD-10-CM | POA: Diagnosis not present

## 2020-03-10 DIAGNOSIS — H57813 Brow ptosis, bilateral: Secondary | ICD-10-CM | POA: Diagnosis not present

## 2020-03-10 DIAGNOSIS — L718 Other rosacea: Secondary | ICD-10-CM | POA: Diagnosis not present

## 2020-03-10 DIAGNOSIS — D485 Neoplasm of uncertain behavior of skin: Secondary | ICD-10-CM | POA: Diagnosis not present

## 2020-03-10 DIAGNOSIS — H02834 Dermatochalasis of left upper eyelid: Secondary | ICD-10-CM | POA: Diagnosis not present

## 2020-03-10 DIAGNOSIS — H02832 Dermatochalasis of right lower eyelid: Secondary | ICD-10-CM | POA: Diagnosis not present

## 2020-03-10 DIAGNOSIS — H53483 Generalized contraction of visual field, bilateral: Secondary | ICD-10-CM | POA: Diagnosis not present

## 2020-03-10 DIAGNOSIS — H02831 Dermatochalasis of right upper eyelid: Secondary | ICD-10-CM | POA: Diagnosis not present

## 2020-03-10 DIAGNOSIS — H0279 Other degenerative disorders of eyelid and periocular area: Secondary | ICD-10-CM | POA: Diagnosis not present

## 2020-03-10 DIAGNOSIS — H02835 Dermatochalasis of left lower eyelid: Secondary | ICD-10-CM | POA: Diagnosis not present

## 2020-03-12 DIAGNOSIS — M5416 Radiculopathy, lumbar region: Secondary | ICD-10-CM | POA: Diagnosis not present

## 2020-03-15 DIAGNOSIS — E291 Testicular hypofunction: Secondary | ICD-10-CM | POA: Diagnosis not present

## 2020-03-17 DIAGNOSIS — M5416 Radiculopathy, lumbar region: Secondary | ICD-10-CM | POA: Diagnosis not present

## 2020-03-22 DIAGNOSIS — H53483 Generalized contraction of visual field, bilateral: Secondary | ICD-10-CM | POA: Diagnosis not present

## 2020-03-29 DIAGNOSIS — E291 Testicular hypofunction: Secondary | ICD-10-CM | POA: Diagnosis not present

## 2020-04-09 DIAGNOSIS — M5416 Radiculopathy, lumbar region: Secondary | ICD-10-CM | POA: Diagnosis not present

## 2020-04-14 DIAGNOSIS — E291 Testicular hypofunction: Secondary | ICD-10-CM | POA: Diagnosis not present

## 2020-04-16 DIAGNOSIS — E7800 Pure hypercholesterolemia, unspecified: Secondary | ICD-10-CM | POA: Diagnosis not present

## 2020-04-16 DIAGNOSIS — I10 Essential (primary) hypertension: Secondary | ICD-10-CM | POA: Diagnosis not present

## 2020-04-26 DIAGNOSIS — M545 Low back pain, unspecified: Secondary | ICD-10-CM | POA: Diagnosis not present

## 2020-04-26 DIAGNOSIS — M5441 Lumbago with sciatica, right side: Secondary | ICD-10-CM | POA: Diagnosis not present

## 2020-04-27 DIAGNOSIS — M5416 Radiculopathy, lumbar region: Secondary | ICD-10-CM | POA: Diagnosis not present

## 2020-04-28 DIAGNOSIS — E291 Testicular hypofunction: Secondary | ICD-10-CM | POA: Diagnosis not present

## 2020-05-07 DIAGNOSIS — M4727 Other spondylosis with radiculopathy, lumbosacral region: Secondary | ICD-10-CM | POA: Diagnosis not present

## 2020-05-10 DIAGNOSIS — E291 Testicular hypofunction: Secondary | ICD-10-CM | POA: Diagnosis not present

## 2020-05-11 DIAGNOSIS — M48062 Spinal stenosis, lumbar region with neurogenic claudication: Secondary | ICD-10-CM | POA: Diagnosis not present

## 2020-05-18 DIAGNOSIS — M47816 Spondylosis without myelopathy or radiculopathy, lumbar region: Secondary | ICD-10-CM | POA: Diagnosis not present

## 2020-05-24 DIAGNOSIS — S39011A Strain of muscle, fascia and tendon of abdomen, initial encounter: Secondary | ICD-10-CM | POA: Diagnosis not present

## 2020-05-24 DIAGNOSIS — K4090 Unilateral inguinal hernia, without obstruction or gangrene, not specified as recurrent: Secondary | ICD-10-CM | POA: Diagnosis not present

## 2020-05-25 DIAGNOSIS — M9903 Segmental and somatic dysfunction of lumbar region: Secondary | ICD-10-CM | POA: Diagnosis not present

## 2020-05-25 DIAGNOSIS — M545 Low back pain: Secondary | ICD-10-CM | POA: Diagnosis not present

## 2020-05-31 DIAGNOSIS — M4696 Unspecified inflammatory spondylopathy, lumbar region: Secondary | ICD-10-CM | POA: Diagnosis not present

## 2020-06-01 DIAGNOSIS — J40 Bronchitis, not specified as acute or chronic: Secondary | ICD-10-CM | POA: Diagnosis not present

## 2020-06-03 DIAGNOSIS — M4696 Unspecified inflammatory spondylopathy, lumbar region: Secondary | ICD-10-CM | POA: Diagnosis not present

## 2020-06-08 DIAGNOSIS — M4696 Unspecified inflammatory spondylopathy, lumbar region: Secondary | ICD-10-CM | POA: Diagnosis not present

## 2020-06-09 DIAGNOSIS — E291 Testicular hypofunction: Secondary | ICD-10-CM | POA: Diagnosis not present

## 2020-06-14 DIAGNOSIS — M4696 Unspecified inflammatory spondylopathy, lumbar region: Secondary | ICD-10-CM | POA: Diagnosis not present

## 2020-06-15 DIAGNOSIS — M9902 Segmental and somatic dysfunction of thoracic region: Secondary | ICD-10-CM | POA: Diagnosis not present

## 2020-06-15 DIAGNOSIS — M9903 Segmental and somatic dysfunction of lumbar region: Secondary | ICD-10-CM | POA: Diagnosis not present

## 2020-06-15 DIAGNOSIS — M5020 Other cervical disc displacement, unspecified cervical region: Secondary | ICD-10-CM | POA: Diagnosis not present

## 2020-06-17 DIAGNOSIS — H02831 Dermatochalasis of right upper eyelid: Secondary | ICD-10-CM | POA: Diagnosis not present

## 2020-06-17 DIAGNOSIS — H02834 Dermatochalasis of left upper eyelid: Secondary | ICD-10-CM | POA: Diagnosis not present

## 2020-06-21 DIAGNOSIS — M4696 Unspecified inflammatory spondylopathy, lumbar region: Secondary | ICD-10-CM | POA: Diagnosis not present

## 2020-06-21 DIAGNOSIS — E291 Testicular hypofunction: Secondary | ICD-10-CM | POA: Diagnosis not present

## 2020-06-28 DIAGNOSIS — E7800 Pure hypercholesterolemia, unspecified: Secondary | ICD-10-CM | POA: Diagnosis not present

## 2020-06-28 DIAGNOSIS — I10 Essential (primary) hypertension: Secondary | ICD-10-CM | POA: Diagnosis not present

## 2020-06-29 ENCOUNTER — Other Ambulatory Visit: Payer: Self-pay | Admitting: Surgery

## 2020-06-29 DIAGNOSIS — R1903 Right lower quadrant abdominal swelling, mass and lump: Secondary | ICD-10-CM | POA: Diagnosis not present

## 2020-06-29 DIAGNOSIS — M4696 Unspecified inflammatory spondylopathy, lumbar region: Secondary | ICD-10-CM | POA: Diagnosis not present

## 2020-07-02 ENCOUNTER — Other Ambulatory Visit: Payer: Self-pay | Admitting: Surgery

## 2020-07-02 DIAGNOSIS — R1903 Right lower quadrant abdominal swelling, mass and lump: Secondary | ICD-10-CM

## 2020-07-02 DIAGNOSIS — E7800 Pure hypercholesterolemia, unspecified: Secondary | ICD-10-CM | POA: Diagnosis not present

## 2020-07-02 DIAGNOSIS — M545 Low back pain, unspecified: Secondary | ICD-10-CM | POA: Diagnosis not present

## 2020-07-02 DIAGNOSIS — Z0000 Encounter for general adult medical examination without abnormal findings: Secondary | ICD-10-CM | POA: Diagnosis not present

## 2020-07-02 DIAGNOSIS — M109 Gout, unspecified: Secondary | ICD-10-CM | POA: Diagnosis not present

## 2020-07-02 DIAGNOSIS — M5412 Radiculopathy, cervical region: Secondary | ICD-10-CM | POA: Diagnosis not present

## 2020-07-02 DIAGNOSIS — Z79899 Other long term (current) drug therapy: Secondary | ICD-10-CM | POA: Diagnosis not present

## 2020-07-02 DIAGNOSIS — I10 Essential (primary) hypertension: Secondary | ICD-10-CM | POA: Diagnosis not present

## 2020-07-02 DIAGNOSIS — R0981 Nasal congestion: Secondary | ICD-10-CM | POA: Diagnosis not present

## 2020-07-02 DIAGNOSIS — E291 Testicular hypofunction: Secondary | ICD-10-CM | POA: Diagnosis not present

## 2020-07-02 DIAGNOSIS — Z125 Encounter for screening for malignant neoplasm of prostate: Secondary | ICD-10-CM | POA: Diagnosis not present

## 2020-07-06 DIAGNOSIS — E291 Testicular hypofunction: Secondary | ICD-10-CM | POA: Diagnosis not present

## 2020-07-08 DIAGNOSIS — M4696 Unspecified inflammatory spondylopathy, lumbar region: Secondary | ICD-10-CM | POA: Diagnosis not present

## 2020-07-09 DIAGNOSIS — E291 Testicular hypofunction: Secondary | ICD-10-CM | POA: Diagnosis not present

## 2020-07-09 DIAGNOSIS — R972 Elevated prostate specific antigen [PSA]: Secondary | ICD-10-CM | POA: Diagnosis not present

## 2020-07-09 DIAGNOSIS — Z125 Encounter for screening for malignant neoplasm of prostate: Secondary | ICD-10-CM | POA: Diagnosis not present

## 2020-07-09 DIAGNOSIS — Z0000 Encounter for general adult medical examination without abnormal findings: Secondary | ICD-10-CM | POA: Diagnosis not present

## 2020-07-09 DIAGNOSIS — D751 Secondary polycythemia: Secondary | ICD-10-CM | POA: Diagnosis not present

## 2020-07-09 DIAGNOSIS — E7800 Pure hypercholesterolemia, unspecified: Secondary | ICD-10-CM | POA: Diagnosis not present

## 2020-07-09 DIAGNOSIS — R413 Other amnesia: Secondary | ICD-10-CM | POA: Diagnosis not present

## 2020-07-09 DIAGNOSIS — I10 Essential (primary) hypertension: Secondary | ICD-10-CM | POA: Diagnosis not present

## 2020-07-09 DIAGNOSIS — M109 Gout, unspecified: Secondary | ICD-10-CM | POA: Diagnosis not present

## 2020-07-19 ENCOUNTER — Telehealth: Payer: Self-pay | Admitting: Cardiovascular Disease

## 2020-07-19 DIAGNOSIS — I712 Thoracic aortic aneurysm, without rupture, unspecified: Secondary | ICD-10-CM

## 2020-07-19 DIAGNOSIS — I10 Essential (primary) hypertension: Secondary | ICD-10-CM

## 2020-07-19 DIAGNOSIS — E291 Testicular hypofunction: Secondary | ICD-10-CM | POA: Diagnosis not present

## 2020-07-19 DIAGNOSIS — I77810 Thoracic aortic ectasia: Secondary | ICD-10-CM

## 2020-07-19 NOTE — Progress Notes (Deleted)
Instruction letter made for CTA per [name] request.

## 2020-07-19 NOTE — Telephone Encounter (Signed)
Patient called and scheduled a follow up visit with [name] on 11/10/20.  Last year he had an echo on 10/24/19 and he states it showed an enlarged aorta. He states he was told when he scheduled his next visit (this year) with [name] he would also need a more thorough examination. He states there was a different test that could highlight this issue in detail, but I don't see where anything has been ordered.  Patient is requesting a call back to discuss order needed for further testing prior to scheduling.

## 2020-07-19 NOTE — Telephone Encounter (Signed)
Per [name] OV noted from 11/14/19 will discuss having a CTA in 1 year.  Pt scheduled a follow up appointment for 11/10/20.  Will forward to [name] to see if he wants testing prior to 11/10/20 appointment. Pt scheduled for CT of abd/pelvis on 07/26/20.

## 2020-07-19 NOTE — Telephone Encounter (Signed)
Called pt notified him of MD recommendation for CT chest due in Sept. I also informed him that he will need lab work to check kidney function prior to CT. He reports he has a business trip at the end of Sept.  I told him to let scheduler know when they call to schedule. He is agreeable to plan and verbalizes understanding.

## 2020-07-26 ENCOUNTER — Other Ambulatory Visit: Payer: Self-pay

## 2020-07-26 ENCOUNTER — Ambulatory Visit
Admission: RE | Admit: 2020-07-26 | Discharge: 2020-07-26 | Disposition: A | Discharge status: Home / Self Care | Payer: PPO | Source: Ambulatory Visit | Attending: Surgery | Admitting: Surgery

## 2020-07-26 DIAGNOSIS — N400 Enlarged prostate without lower urinary tract symptoms: Secondary | ICD-10-CM | POA: Diagnosis not present

## 2020-07-26 DIAGNOSIS — I700 Atherosclerosis of aorta: Secondary | ICD-10-CM | POA: Diagnosis not present

## 2020-07-26 DIAGNOSIS — R1903 Right lower quadrant abdominal swelling, mass and lump: Secondary | ICD-10-CM

## 2020-07-26 DIAGNOSIS — K469 Unspecified abdominal hernia without obstruction or gangrene: Secondary | ICD-10-CM | POA: Diagnosis not present

## 2020-07-26 MED ORDER — IOPAMIDOL (ISOVUE-300) INJECTION 61%
100.0000 mL | Freq: Once | INTRAVENOUS | Status: AC | PRN
  Administered 2020-07-26: 100 mL via INTRAVENOUS

## 2020-07-27 DIAGNOSIS — M4696 Unspecified inflammatory spondylopathy, lumbar region: Secondary | ICD-10-CM | POA: Diagnosis not present

## 2020-08-10 DIAGNOSIS — E291 Testicular hypofunction: Secondary | ICD-10-CM | POA: Diagnosis not present

## 2020-08-23 DIAGNOSIS — E291 Testicular hypofunction: Secondary | ICD-10-CM | POA: Diagnosis not present

## 2020-08-24 DIAGNOSIS — M4696 Unspecified inflammatory spondylopathy, lumbar region: Secondary | ICD-10-CM | POA: Diagnosis not present

## 2020-08-31 DIAGNOSIS — L918 Other hypertrophic disorders of the skin: Secondary | ICD-10-CM | POA: Diagnosis not present

## 2020-08-31 DIAGNOSIS — L821 Other seborrheic keratosis: Secondary | ICD-10-CM | POA: Diagnosis not present

## 2020-08-31 DIAGNOSIS — L738 Other specified follicular disorders: Secondary | ICD-10-CM | POA: Diagnosis not present

## 2020-09-01 DIAGNOSIS — H903 Sensorineural hearing loss, bilateral: Secondary | ICD-10-CM | POA: Diagnosis not present

## 2020-09-05 DIAGNOSIS — E7800 Pure hypercholesterolemia, unspecified: Secondary | ICD-10-CM | POA: Diagnosis not present

## 2020-09-05 DIAGNOSIS — I10 Essential (primary) hypertension: Secondary | ICD-10-CM | POA: Diagnosis not present

## 2020-09-06 DIAGNOSIS — H903 Sensorineural hearing loss, bilateral: Secondary | ICD-10-CM | POA: Diagnosis not present

## 2020-09-06 DIAGNOSIS — H9122 Sudden idiopathic hearing loss, left ear: Secondary | ICD-10-CM | POA: Diagnosis not present

## 2020-09-07 DIAGNOSIS — E291 Testicular hypofunction: Secondary | ICD-10-CM | POA: Diagnosis not present

## 2020-09-20 DIAGNOSIS — E291 Testicular hypofunction: Secondary | ICD-10-CM | POA: Diagnosis not present

## 2020-09-24 DIAGNOSIS — H9122 Sudden idiopathic hearing loss, left ear: Secondary | ICD-10-CM | POA: Diagnosis not present

## 2020-09-24 DIAGNOSIS — H903 Sensorineural hearing loss, bilateral: Secondary | ICD-10-CM | POA: Diagnosis not present

## 2020-09-28 ENCOUNTER — Other Ambulatory Visit: Payer: Self-pay | Admitting: Otolaryngology

## 2020-09-28 DIAGNOSIS — H903 Sensorineural hearing loss, bilateral: Secondary | ICD-10-CM

## 2020-09-28 DIAGNOSIS — M4696 Unspecified inflammatory spondylopathy, lumbar region: Secondary | ICD-10-CM | POA: Diagnosis not present

## 2020-10-04 DIAGNOSIS — E291 Testicular hypofunction: Secondary | ICD-10-CM | POA: Diagnosis not present

## 2020-10-05 ENCOUNTER — Other Ambulatory Visit: Payer: Self-pay | Admitting: Family Medicine

## 2020-10-05 ENCOUNTER — Other Ambulatory Visit (HOSPITAL_COMMUNITY): Payer: Self-pay | Admitting: Family Medicine

## 2020-10-05 DIAGNOSIS — R1030 Lower abdominal pain, unspecified: Secondary | ICD-10-CM

## 2020-10-06 ENCOUNTER — Observation Stay (HOSPITAL_COMMUNITY): Payer: PPO

## 2020-10-06 ENCOUNTER — Observation Stay (HOSPITAL_BASED_OUTPATIENT_CLINIC_OR_DEPARTMENT_OTHER)
Admission: RE | Admit: 2020-10-06 | Discharge: 2020-10-06 | Disposition: A | Discharge status: Home / Self Care | Payer: PPO | Source: Ambulatory Visit | Attending: Family Medicine | Admitting: Family Medicine

## 2020-10-06 ENCOUNTER — Encounter (HOSPITAL_COMMUNITY): Payer: Self-pay

## 2020-10-06 ENCOUNTER — Other Ambulatory Visit: Payer: Self-pay

## 2020-10-06 ENCOUNTER — Inpatient Hospital Stay (HOSPITAL_COMMUNITY)
Admission: EM | Admit: 2020-10-06 | Discharge: 2020-10-08 | DRG: 390 | Disposition: A | Discharge status: Home / Self Care | Payer: PPO | Attending: Family Medicine | Admitting: Family Medicine

## 2020-10-06 DIAGNOSIS — E291 Testicular hypofunction: Secondary | ICD-10-CM | POA: Diagnosis not present

## 2020-10-06 DIAGNOSIS — Z20822 Contact with and (suspected) exposure to covid-19: Secondary | ICD-10-CM | POA: Diagnosis not present

## 2020-10-06 DIAGNOSIS — K565 Intestinal adhesions [bands], unspecified as to partial versus complete obstruction: Secondary | ICD-10-CM | POA: Diagnosis not present

## 2020-10-06 DIAGNOSIS — M47814 Spondylosis without myelopathy or radiculopathy, thoracic region: Secondary | ICD-10-CM | POA: Diagnosis not present

## 2020-10-06 DIAGNOSIS — N3289 Other specified disorders of bladder: Secondary | ICD-10-CM | POA: Diagnosis not present

## 2020-10-06 DIAGNOSIS — Z8249 Family history of ischemic heart disease and other diseases of the circulatory system: Secondary | ICD-10-CM | POA: Diagnosis not present

## 2020-10-06 DIAGNOSIS — E7800 Pure hypercholesterolemia, unspecified: Secondary | ICD-10-CM | POA: Diagnosis not present

## 2020-10-06 DIAGNOSIS — I10 Essential (primary) hypertension: Secondary | ICD-10-CM | POA: Diagnosis present

## 2020-10-06 DIAGNOSIS — K5651 Intestinal adhesions [bands], with partial obstruction: Secondary | ICD-10-CM | POA: Diagnosis not present

## 2020-10-06 DIAGNOSIS — M109 Gout, unspecified: Secondary | ICD-10-CM | POA: Diagnosis not present

## 2020-10-06 DIAGNOSIS — D72828 Other elevated white blood cell count: Secondary | ICD-10-CM | POA: Diagnosis not present

## 2020-10-06 DIAGNOSIS — I77819 Aortic ectasia, unspecified site: Secondary | ICD-10-CM | POA: Diagnosis present

## 2020-10-06 DIAGNOSIS — I493 Ventricular premature depolarization: Secondary | ICD-10-CM | POA: Diagnosis present

## 2020-10-06 DIAGNOSIS — R1030 Lower abdominal pain, unspecified: Secondary | ICD-10-CM | POA: Insufficient documentation

## 2020-10-06 DIAGNOSIS — D751 Secondary polycythemia: Secondary | ICD-10-CM | POA: Diagnosis not present

## 2020-10-06 DIAGNOSIS — K5989 Other specified functional intestinal disorders: Secondary | ICD-10-CM | POA: Diagnosis not present

## 2020-10-06 DIAGNOSIS — Z8041 Family history of malignant neoplasm of ovary: Secondary | ICD-10-CM

## 2020-10-06 DIAGNOSIS — K5939 Other megacolon: Secondary | ICD-10-CM | POA: Diagnosis not present

## 2020-10-06 DIAGNOSIS — K56609 Unspecified intestinal obstruction, unspecified as to partial versus complete obstruction: Secondary | ICD-10-CM | POA: Diagnosis present

## 2020-10-06 DIAGNOSIS — K5900 Constipation, unspecified: Secondary | ICD-10-CM | POA: Diagnosis not present

## 2020-10-06 DIAGNOSIS — R7989 Other specified abnormal findings of blood chemistry: Secondary | ICD-10-CM | POA: Diagnosis present

## 2020-10-06 DIAGNOSIS — Z0189 Encounter for other specified special examinations: Secondary | ICD-10-CM

## 2020-10-06 DIAGNOSIS — K6389 Other specified diseases of intestine: Secondary | ICD-10-CM | POA: Diagnosis not present

## 2020-10-06 DIAGNOSIS — J310 Chronic rhinitis: Secondary | ICD-10-CM | POA: Diagnosis present

## 2020-10-06 DIAGNOSIS — Z4682 Encounter for fitting and adjustment of non-vascular catheter: Secondary | ICD-10-CM | POA: Diagnosis not present

## 2020-10-06 LAB — POCT I-STAT CREATININE: Creatinine, Ser: 1.2 mg/dL (ref 0.61–1.24)

## 2020-10-06 LAB — CBC WITH DIFFERENTIAL/PLATELET
Abs Immature Granulocytes: 0.04 10*3/uL (ref 0.00–0.07)
Basophils Absolute: 0.1 10*3/uL (ref 0.0–0.1)
Basophils Relative: 1 %
Eosinophils Absolute: 0 10*3/uL (ref 0.0–0.5)
Eosinophils Relative: 0 %
HCT: 57.2 % — ABNORMAL HIGH (ref 39.0–52.0)
Hemoglobin: 18.9 g/dL — ABNORMAL HIGH (ref 13.0–17.0)
Immature Granulocytes: 0 %
Lymphocytes Relative: 13 %
Lymphs Abs: 1.4 10*3/uL (ref 0.7–4.0)
MCH: 31.2 pg (ref 26.0–34.0)
MCHC: 33 g/dL (ref 30.0–36.0)
MCV: 94.4 fL (ref 80.0–100.0)
Monocytes Absolute: 1.2 10*3/uL — ABNORMAL HIGH (ref 0.1–1.0)
Monocytes Relative: 12 %
Neutro Abs: 8 10*3/uL — ABNORMAL HIGH (ref 1.7–7.7)
Neutrophils Relative %: 74 %
Platelets: 198 10*3/uL (ref 150–400)
RBC: 6.06 MIL/uL — ABNORMAL HIGH (ref 4.22–5.81)
RDW: 13.2 % (ref 11.5–15.5)
WBC: 10.8 10*3/uL — ABNORMAL HIGH (ref 4.0–10.5)
nRBC: 0 % (ref 0.0–0.2)

## 2020-10-06 LAB — RESP PANEL BY RT-PCR (FLU A&B, COVID) ARPGX2
Influenza A by PCR: NEGATIVE
Influenza B by PCR: NEGATIVE
SARS Coronavirus 2 by RT PCR: NEGATIVE

## 2020-10-06 LAB — COMPREHENSIVE METABOLIC PANEL
ALT: 19 U/L (ref 0–44)
AST: 28 U/L (ref 15–41)
Albumin: 3.9 g/dL (ref 3.5–5.0)
Alkaline Phosphatase: 63 U/L (ref 38–126)
Anion gap: 12 (ref 5–15)
BUN: 14 mg/dL (ref 8–23)
CO2: 24 mmol/L (ref 22–32)
Calcium: 9.2 mg/dL (ref 8.9–10.3)
Chloride: 98 mmol/L (ref 98–111)
Creatinine, Ser: 1.13 mg/dL (ref 0.61–1.24)
GFR, Estimated: >60 mL/min (ref >60)
Glucose, Bld: 89 mg/dL (ref 70–99)
Potassium: 4.3 mmol/L (ref 3.5–5.1)
Sodium: 134 mmol/L — ABNORMAL LOW (ref 135–145)
Total Bilirubin: 1.8 mg/dL — ABNORMAL HIGH (ref 0.3–1.2)
Total Protein: 7.4 g/dL (ref 6.5–8.1)

## 2020-10-06 LAB — MAGNESIUM: Magnesium: 2.3 mg/dL (ref 1.7–2.4)

## 2020-10-06 MED ORDER — MELATONIN 5 MG PO TABS
5.0000 mg | ORAL_TABLET | Freq: Every evening | ORAL | Status: DC | PRN
  Administered 2020-10-07: 5 mg via ORAL
  Filled 2020-10-06: qty 1

## 2020-10-06 MED ORDER — ZOLPIDEM TARTRATE 5 MG PO TABS
5.0000 mg | ORAL_TABLET | Freq: Every evening | ORAL | Status: DC | PRN
  Filled 2020-10-06: qty 1

## 2020-10-06 MED ORDER — MORPHINE SULFATE (PF) 2 MG/ML IV SOLN
1.0000 mg | INTRAVENOUS | Status: DC | PRN
  Administered 2020-10-06 (×2): 1 mg via INTRAVENOUS
  Filled 2020-10-06 (×2): qty 1

## 2020-10-06 MED ORDER — ONDANSETRON HCL 4 MG PO TABS: 4.0000 mg | ORAL_TABLET | Freq: Four times a day (QID) | ORAL | Status: DC | PRN

## 2020-10-06 MED ORDER — SODIUM CHLORIDE 0.9 % IV BOLUS
500.0000 mL | Freq: Once | INTRAVENOUS | Status: AC
  Administered 2020-10-06: 500 mL via INTRAVENOUS

## 2020-10-06 MED ORDER — SODIUM CHLORIDE 0.9 % IV SOLN: INTRAVENOUS | Status: DC

## 2020-10-06 MED ORDER — IOHEXOL 350 MG/ML SOLN
75.0000 mL | Freq: Once | INTRAVENOUS | Status: AC | PRN
  Administered 2020-10-06: 75 mL via INTRAVENOUS

## 2020-10-06 MED ORDER — ACETAMINOPHEN 650 MG RE SUPP: 650.0000 mg | Freq: Four times a day (QID) | RECTAL | Status: DC | PRN

## 2020-10-06 MED ORDER — METOPROLOL TARTRATE 5 MG/5ML IV SOLN: 5.0000 mg | Freq: Four times a day (QID) | INTRAVENOUS | Status: DC | PRN

## 2020-10-06 MED ORDER — DIATRIZOATE MEGLUMINE & SODIUM 66-10 % PO SOLN
90.0000 mL | Freq: Once | ORAL | Status: AC
  Administered 2020-10-06: 90 mL via NASOGASTRIC
  Filled 2020-10-06 (×2): qty 90

## 2020-10-06 MED ORDER — ONDANSETRON HCL 4 MG/2ML IJ SOLN
4.0000 mg | Freq: Once | INTRAMUSCULAR | Status: AC
  Administered 2020-10-06: 4 mg via INTRAVENOUS
  Filled 2020-10-06: qty 2

## 2020-10-06 MED ORDER — HYDROCODONE-ACETAMINOPHEN 5-325 MG PO TABS: 1.0000 | ORAL_TABLET | ORAL | Status: DC | PRN

## 2020-10-06 MED ORDER — ONDANSETRON HCL 4 MG/2ML IJ SOLN: 4.0000 mg | Freq: Four times a day (QID) | INTRAMUSCULAR | Status: DC | PRN

## 2020-10-06 MED ORDER — PHENOL 1.4 % MT LIQD
1.0000 | OROMUCOSAL | Status: DC | PRN
  Administered 2020-10-06: 1 via OROMUCOSAL
  Filled 2020-10-06: qty 177

## 2020-10-06 MED ORDER — ACETAMINOPHEN 325 MG PO TABS: 650.0000 mg | ORAL_TABLET | Freq: Four times a day (QID) | ORAL | Status: DC | PRN

## 2020-10-06 NOTE — ED Provider Notes (Signed)
[facility] EMERGENCY DEPARTMENT Provider Note   CSN: [id number] Arrival date & time: 10/06/20  1040     History Chief Complaint  Patient presents with   Abdominal Pain    Alexander Anderson. is a 75 y.o. male with no significant past medical history who presents to the emergency department today for further evaluation of abdominal pain that began 2 days ago.  He states that he had a normal bowel movement on Monday but began developing generalized abdominal pain later that evening.  It has been constant in nature.  He was seen and evaluated by his primary care provider who ordered an outpatient CT abdomen with contrast for further evaluation.  He was notified today to come to the emergency department.  Chart review of the scan reveals a small bowel obstruction secondary to possible adhesions.  He denies any history of surgical procedures on the abdomen.  He denies any nausea, vomiting, fever, chills, chest pain, shortness of breath, leg pain, leg swelling, urinary complaints.  Has not had any rectal bleeding.  He has been able to pass small amounts of stool.  Per the patient, he had a colonoscopy back in 2016 which was normal.   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   HTN (hypertension)    PVC's (premature ventricular contractions)    Hypercholesterolemia    Adrenal insufficiency (HCC)    Lower back pain    Dysplastic nevus    Low testosterone      Past Surgical History:  Procedure Laterality Date   CATARACT EXTRACTION, BILATERAL  2013   Bil   RETINAL DETACHMENT SURGERY     RIGHT EYE   SHOULDER SURGERY  2008   Left, right       Family History  Problem Relation Age of Onset   Atrial fibrillation Mother    Ovarian cancer Mother    Heart disease Sister     Social History   Tobacco Use   Smoking status: Never   Smokeless tobacco: Never  Substance Use Topics   Alcohol use: Yes    Alcohol/week: 7.0 standard drinks    Types: 7 Standard drinks or equivalent per week   Drug use: No    Home Medications Prior to Admission medications   Medication Sig Start Date End Date Taking? Authorizing Provider  cholecalciferol (VITAMIN D) 1000 UNITS tablet Take 1,000 Units by mouth daily.   Yes [provider]  furosemide (LASIX) 20 MG tablet Take 20 mg by mouth daily.   Yes [provider]  gabapentin (NEURONTIN) 300 MG capsule Take 300 mg by mouth 3 (three) times daily. 09/29/20  Yes [provider]  glucosamine-chondroitin 500-400 MG tablet Take 1 tablet by mouth daily.   Yes [provider]  L-LYSINE PO Take 1 tablet by mouth daily.    Yes [provider]  metoprolol tartrate (LOPRESSOR)  25 MG tablet Take 1 tablet (25 mg total) by mouth 2 (two) times daily. Patient taking differently: Take 25 mg by mouth daily. 10/24/19  Yes Josue Hector, MD  milk thistle 175 MG tablet Take 175 mg by mouth daily.   Yes [provider]  Omega-3 Fatty Acids (FISH OIL) 1000 MG CAPS Take 1 capsule by mouth daily.    Yes [provider]  TURMERIC PO Take 1 tablet by mouth daily.   Yes [provider]  valACYclovir (VALTREX) 1000 MG tablet Take 1,000 mg by mouth as directed. Fever blister 03/17/13  Yes [provider]  valsartan (DIOVAN) 320 MG tablet Take 320 mg by mouth daily.   Yes [provider]  vitamin E 100 UNIT capsule Take 100 Units by mouth daily.     Yes [provider]  testosterone cypionate (DEPOTESTOSTERONE CYPIONATE) 200 MG/ML injection Inject 200 mg into the muscle every 14 (fourteen) days. 08/13/19   [provider]    Allergies    Amlodipine besylate, Ciprofloxacin, Clomiphene, Irbesartan, Oseltamivir, Other, and Penicillins  Review of Systems   Review of Systems  Gastrointestinal:  Positive for abdominal pain.  Genitourinary:  Negative for difficulty urinating, dysuria and hematuria.  All other systems reviewed and are negative.  Physical Exam Updated Vital Signs BP (!) 129/97   Pulse 95   Temp 97.8 F (36.6 C) (Oral)   Resp 14   Ht '6\' 2"'$  (1.88 m)   Wt 93 kg   SpO2 97%   BMI 26.32 kg/m   Physical Exam Constitutional:      General: He is not in acute distress.    Appearance: Normal appearance.  HENT:     Head: Normocephalic and atraumatic.  Eyes:     General:        Right eye: No discharge.        Left eye: No discharge.  Cardiovascular:     Comments: Regular rate and rhythm.  S1/S2 are distinct without any evidence of murmur, rubs, or gallops.  Radial pulses are 2+ bilaterally.  Dorsalis pedis pulses are 2+ bilaterally.  No evidence of pedal edema. Pulmonary:     Comments: Clear to auscultation bilaterally.  Normal effort.  No respiratory distress.  No evidence of wheezes, rales, or rhonchi heard throughout. Abdominal:     General: Abdomen is flat. Bowel sounds are decreased. There is distension.     Tenderness: There is generalized abdominal tenderness. There is no guarding or rebound. Negative signs include psoas sign and obturator sign.  Musculoskeletal:        General: Normal range of motion.     Cervical back: Neck supple.  Skin:    General: Skin is warm and dry.     Findings: No rash.  Neurological:     General: No focal deficit present.     Mental Status: He is alert.  Psychiatric:        Mood and Affect: Mood normal.        Behavior: Behavior normal.    ED Results /  Procedures / Treatments   Labs (all labs ordered are listed, but only abnormal results are displayed) Labs Reviewed  CBC WITH DIFFERENTIAL/PLATELET - Abnormal; Notable for the following components:      Result Value   WBC 10.8 (*)    RBC 6.06 (*)    Hemoglobin 18.9 (*)    HCT 57.2 (*)    Neutro Abs 8.0 (*)    Monocytes Absolute 1.2 (*)  All other components within normal limits  COMPREHENSIVE METABOLIC PANEL - Abnormal; Notable for the following components:   Sodium 134 (*)    Total Bilirubin 1.8 (*)    All other components within normal limits  RESP PANEL BY RT-PCR (FLU A&B, COVID) [id number]    EKG None  Radiology CT ABDOMEN PELVIS W CONTRAST  Result Date: 10/06/2020 CLINICAL DATA:  Left lower quadrant pain, constipation, abdomen feels rigid EXAM: CT ABDOMEN AND PELVIS WITH CONTRAST TECHNIQUE: Multidetector CT imaging of the abdomen and pelvis was performed using the standard protocol following bolus administration of intravenous contrast. CONTRAST:  7m OMNIPAQUE IOHEXOL 350 MG/ML SOLN COMPARISON:  CT abdomen and pelvis 07/26/2020 FINDINGS: Lower chest: No acute abnormality. Hepatobiliary: No focal liver abnormality is seen. No gallstones, gallbladder wall thickening, or biliary dilatation. Pancreas: Unremarkable. No pancreatic ductal dilatation or surrounding inflammatory changes. Spleen: Normal in size without focal abnormality. Adrenals/Urinary Tract: Adrenal glands are unremarkable. Kidneys are normal, without renal calculi, focal lesion, or hydronephrosis. Bladder is unremarkable. Stomach/Bowel: There are multiple dilated loops of small bowel, with a transition point in the lower central abdomen (series 2, image 52 and 63). The stomach is distended. The appendix is normal. Vascular/Lymphatic: Scattered aortoiliac atherosclerotic calcifications. No AAA. No lymphadenopathy. Reproductive: Unremarkable. Other: No abdominal wall hernia or abnormality. No abdominopelvic ascites. No free  intraperitoneal gas. Musculoskeletal: Multilevel degenerative changes of the spine. No acute osseous abnormality. No suspicious lytic or blastic lesions. IMPRESSION: Small-bowel obstruction with transition point in the lower central abdomen, likely due to adhesions. These results will be called to the ordering clinician or representative by the Radiologist Assistant, and communication documented in the PACS or [employer] Electronically Signed   By: JMaurine SimmeringM.D.   On: 10/06/2020 09:48    Procedures Procedures   Medications Ordered in ED Medications  sodium chloride 0.9 % bolus 500 mL (0 mLs Intravenous Stopped 10/06/20 1425)  ondansetron (ZOFRAN) injection 4 mg (4 mg Intravenous Given 10/06/20 1227)    ED Course  I have reviewed the triage vital signs and the nursing notes.  Pertinent labs & imaging results that were available during my care of the patient were reviewed by me and considered in my medical decision making (see chart for details).  Clinical Course as of 10/06/20 1517  Wed Oct 06, 2020  1144 Discussed case with attending. He evaluated the patient at bedside and agrees with plan. [CF]    Clinical Course User Index [CF] FCherrie Gauze  MDM Rules/Calculators/A&P                         PMarina Merryweatheris a 75year old male presents emerged department today for further evaluation of abdominal pain.  Side records were reviewed including CT abdomen pelvis which revealed small bowel obstruction.  Physical exam supports this.  The abdomen.  There are no evidence of peritoneal signs on exam.  Abdomen is tympanic to percussion with tinkling bowel sounds.  CBC reveals leukocytosis with a slightly elevated hemoglobin and hematocrit.  CMP was within normal limits.  Given the clinical scenario and findings on CT, he has a small bowel obstruction.  This is likely the cause of his abdominal pain and nausea.  He had a small bowel movement here with little bits of formed stool.   Nausea was controlled with Zofran.  Will keep him n.p.o., treat pain appropriately, and admit to the hospitalist for observation.  Final Clinical Impression(s) / ED  Diagnoses Final diagnoses:  SBO (small bowel obstruction) [facility])    Rx / DC Orders ED Discharge Orders     None        Cherrie Gauze 10/06/20 1518    Noemi Chapel, MD 10/17/20 [phone]

## 2020-10-06 NOTE — ED Notes (Signed)
Called to give report, RN currently unavailable, receiving RN will call back to receive report when out of patient's room.

## 2020-10-06 NOTE — ED Triage Notes (Signed)
Pt reports abd pain since Monday, saw PCP and had CT scan done outpt this morning. CT showed a small blockage.  Pt had tiny BM last night, hasn't had one since Monday. Denies n/v.

## 2020-10-06 NOTE — Consult Note (Signed)
Alexander Anderson. February 21, 1945  [id number].    Requesting MD: Dr. Rogers Blocker Chief Complaint/Reason for Consult: SBO  HPI: Alexander Anderson. is a 75 y.o. male with a hx of adrenal insufficiency, HTN and HLD who presented with abdominal pain. Patient reports 2 days ago he began having constant, generalized abdominal pain that has gradually worsened since onset and now has become mod-severe. He reports associated nausea and bloating. He has had episode of emesis x1 today that he describes as clear liquid consistent with contrast from earlier scan. LOI yesterday. He has had a few very small bowel movements today and yesterday. He denies hx of similar symptoms in the past. Nothing seems to make his symptoms better or worse. He went to his PCP for evaluation and had a CT scan done that showed a SBO w/ transition in the lower central abdomen. He came to the ED for evaluation. His last colonoscopy was in 2016 with Dr. Deatra Ina and was normal. No prior abdominal surgeries in the past. No previous hx of SBO. We were asked to see.   ROS: Review of Systems  Constitutional:  Negative for chills and fever.  Respiratory:  Negative for cough and shortness of breath.   Cardiovascular:  Negative for chest pain and leg swelling.  Gastrointestinal:  Positive for abdominal pain. Negative for blood in stool, constipation, diarrhea, nausea and vomiting.  Genitourinary:  Negative for dysuria and hematuria.  Psychiatric/Behavioral:  Negative for substance abuse.   All other systems reviewed and are negative.  Family History  Problem Relation Age of Onset   Atrial fibrillation Mother    Ovarian cancer Mother    Heart disease Sister     Past Medical History:  Diagnosis Date   Adrenal insufficiency ([place])    Cataract    Dysplastic nevus    [name]   HTN (hypertension)    Benign   Hypercholesterolemia    Low testosterone    Lower back pain    PVC's (premature ventricular contractions)    STRESS TEST IN  PASR    Past Surgical History:  Procedure Laterality Date   CATARACT EXTRACTION, BILATERAL  2013   Bil   RETINAL DETACHMENT SURGERY     RIGHT EYE   SHOULDER SURGERY  2008   Left, right    Social History:  reports that he has never smoked. He has never used smokeless tobacco. He reports current alcohol use of about 7.0 standard drinks per week. He reports that he does not use drugs.  Allergies:  Allergies  Allergen Reactions   Amlodipine Besylate     Other reaction(s): ankle edema   Ciprofloxacin     CAUSED SORE THROAT Other reaction(s): sore throat   Clomiphene     Other reaction(s): hot flashes   Irbesartan Cough   Oseltamivir     Other reaction(s): nausea, vomiting   Other Other (See Comments)   Penicillins     CAUSED FACE TO TURN RED AS CHILD.    (Not in a hospital admission)    Physical Exam: Blood pressure (!) 129/97, pulse 95, temperature 97.8 F (36.6 C), temperature source Oral, resp. rate 14, height '6\' 2"'$  (1.88 m), weight 93 kg, SpO2 97 %. General: pleasant, WD/WN white male who is laying in bed in NAD HEENT: head is normocephalic, atraumatic.  Sclera are noninjected.  Pupils equal and round.  Ears and nose without any masses or lesions.  Mouth is pink and moist. Heart: regular rate. Occasional  PVC.  Normal s1,s2. No obvious murmurs, gallops, or rubs noted.  Palpable pedal pulses bilaterally  Lungs: CTAB, no wheezes, rhonchi, or rales noted.  Respiratory effort nonlabored Abd: Soft, mild distension, focal suprapubic tenderness without peritonitis, hypoactive BS, no masses, hernias, or organomegaly MS: no BUE/BLE edema, calves soft and nontender Skin: warm and dry with no masses, lesions, or rashes Psych: A&Ox4 with an appropriate affect Neuro: cranial nerves grossly intact, equal strength in BUE/BLE bilaterally, normal speech, thought process intact, moves all extremities, gait not assessed   Results for orders placed or performed during the hospital  encounter of 10/06/20 (from the past 48 hour(s))  CBC with Differential     Status: Abnormal   Collection Time: 10/06/20 12:28 PM  Result Value Ref Range   WBC 10.8 (H) 4.0 - 10.5 K/uL   RBC 6.06 (H) 4.22 - 5.81 MIL/uL   Hemoglobin 18.9 (H) 13.0 - 17.0 g/dL   HCT 57.2 (H) 39.0 - 52.0 %   MCV 94.4 80.0 - 100.0 fL   MCH 31.2 26.0 - 34.0 pg   MCHC 33.0 30.0 - 36.0 g/dL   RDW 13.2 11.5 - 15.5 %   Platelets 198 150 - 400 K/uL   nRBC 0.0 0.0 - 0.2 %   Neutrophils Relative % 74 %   Neutro Abs 8.0 (H) 1.7 - 7.7 K/uL   Lymphocytes Relative 13 %   Lymphs Abs 1.4 0.7 - 4.0 K/uL   Monocytes Relative 12 %   Monocytes Absolute 1.2 (H) 0.1 - 1.0 K/uL   Eosinophils Relative 0 %   Eosinophils Absolute 0.0 0.0 - 0.5 K/uL   Basophils Relative 1 %   Basophils Absolute 0.1 0.0 - 0.1 K/uL   Immature Granulocytes 0 %   Abs Immature Granulocytes 0.04 0.00 - 0.07 K/uL    Comment: Performed at [facility], [address], [place], [place] [address]  Comprehensive metabolic panel     Status: Abnormal   Collection Time: 10/06/20 12:28 PM  Result Value Ref Range   Sodium 134 (L) 135 - 145 mmol/L   Potassium 4.3 3.5 - 5.1 mmol/L   Chloride 98 98 - 111 mmol/L   CO2 24 22 - 32 mmol/L   Glucose, Bld 89 70 - 99 mg/dL    Comment: Glucose reference range applies only to samples taken after fasting for at least 8 hours.   BUN 14 8 - 23 mg/dL   Creatinine, Ser 1.13 0.61 - 1.24 mg/dL   Calcium 9.2 8.9 - 10.3 mg/dL   Total Protein 7.4 6.5 - 8.1 g/dL   Albumin 3.9 3.5 - 5.0 g/dL   AST 28 15 - 41 U/L   ALT 19 0 - 44 U/L   Alkaline Phosphatase 63 38 - 126 U/L   Total Bilirubin 1.8 (H) 0.3 - 1.2 mg/dL   GFR, Estimated >60 >60 mL/min    Comment: (NOTE) Calculated using the CKD-EPI Creatinine Equation (2021)    Anion gap 12 5 - 15    Comment: Performed at [facility] [address], [place], [place] [address]  Resp Panel by RT-PCR (Flu A&B, Covid) Nasopharyngeal Swab     Status: None    Collection Time: 10/06/20 12:30 PM   Specimen: Nasopharyngeal Swab; Nasopharyngeal(NP) swabs in vial transport medium  Result Value Ref Range   SARS Coronavirus 2 by RT PCR NEGATIVE NEGATIVE    Comment: (NOTE) SARS-CoV-2 target nucleic acids are NOT DETECTED.  The SARS-CoV-2 RNA is generally  detectable in upper respiratory specimens during the acute phase of infection. The lowest concentration of SARS-CoV-2 viral copies this assay can detect is 138 copies/mL. A negative result does not preclude SARS-Cov-2 infection and should not be used as the sole basis for treatment or other patient management decisions. A negative result may occur with  improper specimen collection/handling, submission of specimen other than nasopharyngeal swab, presence of viral mutation(s) within the areas targeted by this assay, and inadequate number of viral copies(<138 copies/mL). A negative result must be combined with clinical observations, patient history, and epidemiological information. The expected result is Negative.  Fact Sheet for Patients:  EntrepreneurPulse.com.au  Fact Sheet for Healthcare Providers:  IncredibleEmployment.be  This test is no t yet approved or cleared by the [place] FDA and  has been authorized for detection and/or diagnosis of SARS-CoV-2 by FDA under an Emergency Use Authorization (EUA). This EUA will remain  in effect (meaning this test can be used) for the duration of the COVID-19 declaration under Section 564(b)(1) of the Act, 21 U.S.C.section 360bbb-3(b)(1), unless the authorization is terminated  or revoked sooner.       Influenza A by PCR NEGATIVE NEGATIVE   Influenza B by PCR NEGATIVE NEGATIVE    Comment: (NOTE) The Xpert Xpress SARS-CoV-2/FLU/RSV plus assay is intended as an aid in the diagnosis of influenza from Nasopharyngeal swab specimens and should not be used as a sole basis for treatment. Nasal washings  and aspirates are unacceptable for Xpert Xpress SARS-CoV-2/FLU/RSV testing.  Fact Sheet for Patients: EntrepreneurPulse.com.au  Fact Sheet for Healthcare Providers: IncredibleEmployment.be  This test is not yet approved or cleared by the [place] FDA and has been authorized for detection and/or diagnosis of SARS-CoV-2 by FDA under an Emergency Use Authorization (EUA). This EUA will remain in effect (meaning this test can be used) for the duration of the COVID-19 declaration under Section 564(b)(1) of the Act, 21 U.S.C. section 360bbb-3(b)(1), unless the authorization is terminated or revoked.  Performed at [facility], [place] [address], [place],  [address]    CT ABDOMEN PELVIS W CONTRAST  Result Date: 10/06/2020 CLINICAL DATA:  Left lower quadrant pain, constipation, abdomen feels rigid EXAM: CT ABDOMEN AND PELVIS WITH CONTRAST TECHNIQUE: Multidetector CT imaging of the abdomen and pelvis was performed using the standard protocol following bolus administration of intravenous contrast. CONTRAST:  25m OMNIPAQUE IOHEXOL 350 MG/ML SOLN COMPARISON:  CT abdomen and pelvis 07/26/2020 FINDINGS: Lower chest: No acute abnormality. Hepatobiliary: No focal liver abnormality is seen. No gallstones, gallbladder wall thickening, or biliary dilatation. Pancreas: Unremarkable. No pancreatic ductal dilatation or surrounding inflammatory changes. Spleen: Normal in size without focal abnormality. Adrenals/Urinary Tract: Adrenal glands are unremarkable. Kidneys are normal, without renal calculi, focal lesion, or hydronephrosis. Bladder is unremarkable. Stomach/Bowel: There are multiple dilated loops of small bowel, with a transition point in the lower central abdomen (series 2, image 52 and 63). The stomach is distended. The appendix is normal. Vascular/Lymphatic: Scattered aortoiliac atherosclerotic calcifications. No AAA. No lymphadenopathy. Reproductive:  Unremarkable. Other: No abdominal wall hernia or abnormality. No abdominopelvic ascites. No free intraperitoneal gas. Musculoskeletal: Multilevel degenerative changes of the spine. No acute osseous abnormality. No suspicious lytic or blastic lesions. IMPRESSION: Small-bowel obstruction with transition point in the lower central abdomen, likely due to adhesions. These results will be called to the ordering clinician or representative by the Radiologist Assistant, and communication documented in the PACS or [employer] Electronically Signed   By: JIleene PatrickD.  On: 10/06/2020 09:48    Anti-infectives (From admission, onward)    None       Assessment/Plan SBO - CT w/ SBO w/ transition point in the lower central abdomen. No prior abdominal surgical history - No current indication for emergency surgery - Place NGT for decompression and keep NPO - Start SBO protocol - Keep K > 4 and Mg > 2 for bowel function - Mobilize for bowel function - Hopefully patient will improve with conservative management. If patient fails to improve with conservative management, he may require exploratory surgery during admission - Agree with medical admission. We will follow with you.   FEN - NPO, NGT, IV VTE - SCDs, okay for chemical prophylaxis from a general surgery standpoint  ID - None indicated from our standpoint  Per primary HTN  Richard Miu, [facility] 10/06/2020, 3:56 PM Please see Amion for pager number during day hours 7:00am-4:30pm

## 2020-10-06 NOTE — H&P (Addendum)
History and Physical    Alexander Anderson. [id number] DOB: Jul 18, 1945 DOA: 10/06/2020  PCP: Lawerance Cruel, MD Consultants:  [name]: cardiology, Dr. Ronnald Ramp: Dermatology  Patient coming from:  Home - lives with his wife   Chief Complaint: abdominal pain   HPI: Alexander Anderson. is a 75 y.o. male with medical history significant of HTN, HLD, low testosterone, subdural hematoma in 2019 after a ski accident and spinal stenosis presenting with 2 day history of abdominal pain. He states Monday afternoon he started to have intermittent pain in his lower abdomen that would come in waves. Pain rated as a 9/10 and described as dull with no radiation. Pain would last for 30-45 minutes then go away. Movement made it worse.  He had no BM Monday night. Tuesday he went to see his PCP. He states his stomach was distended and tender and they sent him for a CT this AM for concerns of appendicitis vs. diverticulitis. Tuesday night he had a very small bowel movement that he describes as finger sized. He had another BM this AM and afternoon, but this afternoon was only liquid. Denies any blood in his stool.   He had an episode of vomiting here in ER for the first time after drinking contrast. He denies any fever/chills. He denies any abdominal surgery in his lifetime. The pain is 1/10 while he is laying still, but if he gets up and walks around it goes up to a 4/10. Pain has improved since Monday night. Pain not really coming in waves anymore either. He did get tramadol and last took one last night. Not sure it helped much. Has not eaten since last night.  Last colonoscopy: 2016 and wnl.   Denies any fever/chills, denies any shortness of breath, coughing, chest pain, palpitations, leg swelling, skin lesions, headaches, vision changes. He denies any N/V/D except for one episode of emesis after contrast.    ED Course: vitals: afebrile, bp: 157/99, HR: 106, RR: 16, oxygen: 98% RA.  Pertinent labs: wbc: 10.8,  hgb: 18.9/hct: 57.2, given 500cc bolus NS, zofran and made NPO. Called and asked to admit for SBO.   Review of Systems: As per HPI; otherwise review of systems reviewed and negative.   Ambulatory Status:  Ambulates without assistance   Past Medical History:  Diagnosis Date   Adrenal insufficiency (HCC)    Cataract    Dysplastic nevus    [name]   HTN (hypertension)    Benign   Hypercholesterolemia    Low testosterone    Lower back pain    PVC's (premature ventricular contractions)    STRESS TEST IN [facility]    Past Surgical History:  Procedure Laterality Date   CATARACT EXTRACTION, BILATERAL  2013   Bil   RETINAL DETACHMENT SURGERY     RIGHT EYE   SHOULDER SURGERY  2008   Left, right    Social History   Socioeconomic History   Marital status: Married    Spouse name: Not on file   Number of children: Not on file   Years of education: Not on file   Highest education level: Not on file  Occupational History   Not on file  Tobacco Use   Smoking status: Never   Smokeless tobacco: Never  Substance and Sexual Activity   Alcohol use: Yes    Alcohol/week: 7.0 standard drinks    Types: 7 Standard drinks or equivalent per week   Drug use: No   Sexual activity:  Not on file  Other Topics Concern   Not on file  Social History Narrative   Not on file   Social Determinants of Health   Financial Resource Strain: Not on file  Food Insecurity: Not on file  Transportation Needs: Not on file  Physical Activity: Not on file  Stress: Not on file  Social Connections: Not on file  Intimate Partner Violence: Not on file    Allergies  Allergen Reactions   Amlodipine Besylate     Other reaction(s): ankle edema   Ciprofloxacin     CAUSED SORE THROAT Other reaction(s): sore throat   Clomiphene     Other reaction(s): hot flashes   Irbesartan Cough   Oseltamivir     Other reaction(s): nausea, vomiting   Other Other (See Comments)   Penicillins     CAUSED FACE TO TURN  RED AS CHILD.    Family History  Problem Relation Age of Onset   Atrial fibrillation Mother    Ovarian cancer Mother    Heart disease Sister     Prior to Admission medications   Medication Sig Start Date End Date Taking? Authorizing Provider  cholecalciferol (VITAMIN D) 1000 UNITS tablet Take 1,000 Units by mouth daily.    [provider]  furosemide (LASIX) 20 MG tablet Take 20 mg by mouth daily.    [provider]  glucosamine-chondroitin 500-400 MG tablet Take 1 tablet by mouth daily.    [provider]  L-LYSINE PO Take 1 tablet by mouth daily.     [provider]  metoprolol tartrate (LOPRESSOR) 25 MG tablet Take 1 tablet (25 mg total) by mouth 2 (two) times daily. 10/24/19   Josue Hector, MD  milk thistle 175 MG tablet Take 175 mg by mouth daily.    [provider]  Omega-3 Fatty Acids (FISH OIL) 1000 MG CAPS Take 1 capsule by mouth daily.     [provider]  testosterone cypionate (DEPOTESTOSTERONE CYPIONATE) 200 MG/ML injection Inject 200 mg into the muscle every 14 (fourteen) days. 08/13/19   [provider]  TURMERIC PO Take 1 tablet by mouth daily.    [provider]  valACYclovir (VALTREX) 1000 MG tablet Take 1,000 mg by mouth as directed. Fever blister 03/17/13   [provider]  valsartan (DIOVAN) 320 MG tablet Take 320 mg by mouth daily.    [provider]  vitamin E 100 UNIT capsule Take 100 Units by mouth daily.     [provider]    Physical Exam: Vitals:   10/06/20 1151 10/06/20 1315 10/06/20 1345 10/06/20 1430  BP: (!) 151/104 127/85 (!) 136/95 (!) 129/97  Pulse: 96 92 96 95  Resp: (!) '25 13 16 14  '$ Temp:      TempSrc:      SpO2: 98% 96% 98% 97%  Weight:      Height:         General:  Appears calm and comfortable and is in NAD Eyes:  PERRL, EOMI, normal lids, iris ENT:  grossly normal hearing, lips & tongue, mmm; appropriate dentition Neck:  no LAD, masses  or thyromegaly; no carotid bruits Cardiovascular:  RRR, no m/r/g. No LE edema.  Respiratory:   CTA bilaterally with no wheezes/rales/rhonchi.  Normal respiratory effort. Abdomen: distended, TTP in bilateral lower quadrants, umbilical region. Tympanic to percussion. BS absent.  Back:   normal alignment, no CVAT Skin:  no rash or induration seen on limited exam Musculoskeletal:  grossly normal tone  BUE/BLE, good ROM, no bony abnormality Lower extremity:  No LE edema.  Limited foot exam with no ulcerations.  2+ distal pulses. Psychiatric:  grossly normal mood and affect, speech fluent and appropriate, AOx3 Neurologic:  CN 2-12 grossly intact, moves all extremities in coordinated fashion, sensation intact    Radiological Exams on Admission: Independently reviewed - see discussion in A/P where applicable  CT ABDOMEN PELVIS W CONTRAST  Result Date: 10/06/2020 CLINICAL DATA:  Left lower quadrant pain, constipation, abdomen feels rigid EXAM: CT ABDOMEN AND PELVIS WITH CONTRAST TECHNIQUE: Multidetector CT imaging of the abdomen and pelvis was performed using the standard protocol following bolus administration of intravenous contrast. CONTRAST:  75m OMNIPAQUE IOHEXOL 350 MG/ML SOLN COMPARISON:  CT abdomen and pelvis 07/26/2020 FINDINGS: Lower chest: No acute abnormality. Hepatobiliary: No focal liver abnormality is seen. No gallstones, gallbladder wall thickening, or biliary dilatation. Pancreas: Unremarkable. No pancreatic ductal dilatation or surrounding inflammatory changes. Spleen: Normal in size without focal abnormality. Adrenals/Urinary Tract: Adrenal glands are unremarkable. Kidneys are normal, without renal calculi, focal lesion, or hydronephrosis. Bladder is unremarkable. Stomach/Bowel: There are multiple dilated loops of small bowel, with a transition point in the lower central abdomen (series 2, image 52 and 63). The stomach is distended. The appendix is normal. Vascular/Lymphatic: Scattered  aortoiliac atherosclerotic calcifications. No AAA. No lymphadenopathy. Reproductive: Unremarkable. Other: No abdominal wall hernia or abnormality. No abdominopelvic ascites. No free intraperitoneal gas. Musculoskeletal: Multilevel degenerative changes of the spine. No acute osseous abnormality. No suspicious lytic or blastic lesions. IMPRESSION: Small-bowel obstruction with transition point in the lower central abdomen, likely due to adhesions. These results will be called to the ordering clinician or representative by the Radiologist Assistant, and communication documented in the PACS or [employer] Electronically Signed   By: JMaurine SimmeringM.D.   On: 10/06/2020 09:48    EKG: Independently reviewed.  NSR with rate 98; nonspecific ST changes with no evidence of acute ischemia   Labs on Admission: I have personally reviewed the available labs and imaging studies at the time of the admission.  Pertinent labs:  wbc: 10.8 hgb: 18.9/hct: 57.2,  Covid/flu: negative    Assessment/Plan Principal Problem:   Small bowel obstruction due to adhesions ([facility] 75year old male with 2 day history of abdominal pain and distention, change in BM with SBO findings on CT. Has had no hx of abdominal surgery in the past.  -general surgery consulted, continue conservative management for now. F/u on recommendations  -NPO and gentle IVF started  -tylenol and norco prn for pain, escalate if needed -zofran prn for nausea  Active Problems: Leukocytosis -likely more reactive/concentrated than infectious -no fever/chills. Blood cultures to be drawn if develops fever  -monitor closely with SBO -IVF and repeat CBC in AM    HTN (hypertension) -has been well controlled. Last took medication yesterday -restart his metoprolol  -can not order diovan as our substitute is irbesartan and he is allergic to this.  -IV lopressor PRN ordered for HTN with parameters.     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 NJohnsie Cancel  -asymptomatic with echo 9/21 with no structural heart disease.     Hypercholesterolemia -no  lipid panel in system -[facility]s [facility] and [facility]ology    Low testosterone  -had his injection on Monday -likely cause of his polycythemia -monitored outpatient by pcp   Gout -on no medication -diet controlled   Aortic dilation  -measuring up to 5m on echo in 10/2019 -recommend f/u CTA in 1 year  Body mass index is 26.32 kg/m.   Level of care: Telemetry Medical DVT prophylaxis:  SCDs in case of surgery  Code Status:  Full - confirmed with patient Family Communication: None present Disposition Plan:  The patient is from: home  Anticipated d/c is to: home Requires inpatient hospitalization and is at significant risk of worsening, requires constant monitoring, assessment and MDM with specialists.   Patient is currently: acutely ill Consults called: general surgery   Admission status:  observation    AOrma FlamingMD [facility]   How to contact the TR[facility] Hospital & Nsg HomeAttending or Consulting provider [place] covering provider during after hours [place] for this patient?  Check the care team in [facility] look for a) attending/consulting TR[facility] provider listed and b) the TQu[facility]oscopy LLCteam listed Log into [email or web address] and use [facility]'s universal password to access. If you do not have the password, please contact the hospital operator. Locate the TCar[facility]ont Regional Medical Centerprovider you are looking for under [facility] and page to a number that you can be directly reached. If you still have difficulty reaching the  provider, please page the [facility](Director on Call) for the Hospitalists listed on amion for assistance.   10/06/2020, 3:49 PM

## 2020-10-06 NOTE — ED Provider Notes (Signed)
Medical screening examination/treatment/procedure(s) were conducted as a shared visit with non-physician practitioner(s) and myself.  I personally evaluated the patient during the encounter.  Clinical Impression:   Final diagnoses:  None   PT with abdominal distention - there is no prior abdominal surgery including appendicitis cholecystitis or hernia repairs in fact he states he is even had a normal colonoscopy and there was no abnormal findings.  He has had approximately 2 days of intermittent colicky abdominal pain which becomes severe at times prompting him to visit his doctor yesterday, CT scan was ordered for today, results showed small bowel obstruction with a transition point, possibly adhesions but no signs of cancer.  On exam the patient is distended tympanitic minimally tender and is not currently nauseated.  Vital signs are unremarkable, borderline tachycardia, history of paroxysmal atrial fibrillation but not anticoagulated.  At this time I do not think an NG tube is needed but the patient will need to be admitted to the hospital.  He does not have a surgical abdomen   Noemi Chapel, MD 10/17/20 [phone]

## 2020-10-07 ENCOUNTER — Observation Stay (HOSPITAL_COMMUNITY): Payer: PPO

## 2020-10-07 DIAGNOSIS — E291 Testicular hypofunction: Secondary | ICD-10-CM | POA: Diagnosis present

## 2020-10-07 DIAGNOSIS — Z8041 Family history of malignant neoplasm of ovary: Secondary | ICD-10-CM | POA: Diagnosis not present

## 2020-10-07 DIAGNOSIS — Z8249 Family history of ischemic heart disease and other diseases of the circulatory system: Secondary | ICD-10-CM | POA: Diagnosis not present

## 2020-10-07 DIAGNOSIS — D751 Secondary polycythemia: Secondary | ICD-10-CM | POA: Diagnosis present

## 2020-10-07 DIAGNOSIS — J310 Chronic rhinitis: Secondary | ICD-10-CM | POA: Diagnosis present

## 2020-10-07 DIAGNOSIS — K5651 Intestinal adhesions [bands], with partial obstruction: Secondary | ICD-10-CM | POA: Diagnosis present

## 2020-10-07 DIAGNOSIS — Z4682 Encounter for fitting and adjustment of non-vascular catheter: Secondary | ICD-10-CM | POA: Diagnosis not present

## 2020-10-07 DIAGNOSIS — K565 Intestinal adhesions [bands], unspecified as to partial versus complete obstruction: Secondary | ICD-10-CM

## 2020-10-07 DIAGNOSIS — I10 Essential (primary) hypertension: Secondary | ICD-10-CM | POA: Diagnosis present

## 2020-10-07 DIAGNOSIS — I77819 Aortic ectasia, unspecified site: Secondary | ICD-10-CM | POA: Diagnosis present

## 2020-10-07 DIAGNOSIS — D72828 Other elevated white blood cell count: Secondary | ICD-10-CM | POA: Diagnosis present

## 2020-10-07 DIAGNOSIS — K56609 Unspecified intestinal obstruction, unspecified as to partial versus complete obstruction: Secondary | ICD-10-CM | POA: Diagnosis present

## 2020-10-07 DIAGNOSIS — E7800 Pure hypercholesterolemia, unspecified: Secondary | ICD-10-CM | POA: Diagnosis present

## 2020-10-07 DIAGNOSIS — I493 Ventricular premature depolarization: Secondary | ICD-10-CM | POA: Diagnosis present

## 2020-10-07 DIAGNOSIS — N3289 Other specified disorders of bladder: Secondary | ICD-10-CM | POA: Diagnosis not present

## 2020-10-07 DIAGNOSIS — M109 Gout, unspecified: Secondary | ICD-10-CM | POA: Diagnosis present

## 2020-10-07 DIAGNOSIS — Z20822 Contact with and (suspected) exposure to covid-19: Secondary | ICD-10-CM | POA: Diagnosis present

## 2020-10-07 DIAGNOSIS — K6389 Other specified diseases of intestine: Secondary | ICD-10-CM | POA: Diagnosis not present

## 2020-10-07 LAB — BASIC METABOLIC PANEL
Anion gap: 12 (ref 5–15)
BUN: 15 mg/dL (ref 8–23)
CO2: 22 mmol/L (ref 22–32)
Calcium: 8.6 mg/dL — ABNORMAL LOW (ref 8.9–10.3)
Chloride: 102 mmol/L (ref 98–111)
Creatinine, Ser: 1.07 mg/dL (ref 0.61–1.24)
GFR, Estimated: >60 mL/min (ref >60)
Glucose, Bld: 96 mg/dL (ref 70–99)
Potassium: 3.9 mmol/L (ref 3.5–5.1)
Sodium: 136 mmol/L (ref 135–145)

## 2020-10-07 LAB — CBC
HCT: 51.2 % (ref 39.0–52.0)
Hemoglobin: 17.3 g/dL — ABNORMAL HIGH (ref 13.0–17.0)
MCH: 31.4 pg (ref 26.0–34.0)
MCHC: 33.8 g/dL (ref 30.0–36.0)
MCV: 92.9 fL (ref 80.0–100.0)
Platelets: 200 10*3/uL (ref 150–400)
RBC: 5.51 MIL/uL (ref 4.22–5.81)
RDW: 13.1 % (ref 11.5–15.5)
WBC: 10.1 10*3/uL (ref 4.0–10.5)
nRBC: 0 % (ref 0.0–0.2)

## 2020-10-07 LAB — MAGNESIUM: Magnesium: 2.2 mg/dL (ref 1.7–2.4)

## 2020-10-07 MED ORDER — METOPROLOL TARTRATE 25 MG PO TABS
25.0000 mg | ORAL_TABLET | Freq: Two times a day (BID) | ORAL | Status: DC
  Administered 2020-10-07 – 2020-10-08 (×2): 25 mg via ORAL
  Filled 2020-10-07 (×2): qty 1

## 2020-10-07 MED ORDER — GABAPENTIN 300 MG PO CAPS
300.0000 mg | ORAL_CAPSULE | Freq: Three times a day (TID) | ORAL | Status: DC
  Administered 2020-10-07 – 2020-10-08 (×3): 300 mg via ORAL
  Filled 2020-10-07 (×3): qty 1

## 2020-10-07 NOTE — Progress Notes (Signed)
Subjective: CC: Doing well. Feels much better when compared to yesterday. NGT w/ 400cc out. Reports nausea, distension and pain have resolved. Several liquid bm's and he believes some flatus. Contrast in colon on xray.   Objective: Vital signs in last 24 hours: Temp:  [97.7 F (36.5 C)-98.6 F (37 C)] 98.6 F (37 C) (09/01 0833) Pulse Rate:  [89-106] 92 (09/01 0833) Resp:  [13-25] 18 (09/01 0833) BP: (127-159)/(85-104) 148/89 (09/01 0833) SpO2:  [95 %-99 %] 95 % (09/01 0833) Weight:  [93 kg] 93 kg (08/31 1057) Last BM Date: 10/06/20  Intake/Output from previous day: 08/31 0701 - 09/01 0700 In: 1393 [I.V.:893; IV Piggyback:500] Out: 400 [Emesis/NG output:400] Intake/Output this shift: No intake/output data recorded.  PE: Gen:  Alert, NAD, pleasant Pulm: Rate and effort normal  Abd: Soft, ND, NT +BS, NGT in place on LIWS Psych: A&Ox3  Skin: no rashes noted, warm and dry  Lab Results:  Recent Labs    10/06/20 1228 10/07/20 0257  WBC 10.8* 10.1  HGB 18.9* 17.3*  HCT 57.2* 51.2  PLT 198 200   BMET Recent Labs    10/06/20 1228 10/07/20 0257  NA 134* 136  K 4.3 3.9  CL 98 102  CO2 24 22  GLUCOSE 89 96  BUN 14 15  CREATININE 1.13 1.07  CALCIUM 9.2 8.6*   PT/INR No results for input(s): LABPROT, INR in the last 72 hours. CMP     Component Value Date/Time   NA 136 10/07/2020 0257   NA 140 03/23/2015 1528   K 3.9 10/07/2020 0257   K 4.2 03/23/2015 1528   CL 102 10/07/2020 0257   CO2 22 10/07/2020 0257   CO2 29 03/23/2015 1528   GLUCOSE 96 10/07/2020 0257   GLUCOSE 92 03/23/2015 1528   BUN 15 10/07/2020 0257   BUN 16.2 03/23/2015 1528   CREATININE 1.07 10/07/2020 0257   CREATININE 1.1 03/23/2015 1528   CALCIUM 8.6 (L) 10/07/2020 0257   CALCIUM 9.7 03/23/2015 1528   PROT 7.4 10/06/2020 1228   PROT 7.4 03/23/2015 1528   ALBUMIN 3.9 10/06/2020 1228   ALBUMIN 3.9 03/23/2015 1528   AST 28 10/06/2020 1228   AST 21 03/23/2015 1528   ALT 19  10/06/2020 1228   ALT 23 03/23/2015 1528   ALKPHOS 63 10/06/2020 1228   ALKPHOS 69 03/23/2015 1528   BILITOT 1.8 (H) 10/06/2020 1228   BILITOT 0.46 03/23/2015 1528   GFRNONAA >60 10/07/2020 0257   Lipase  No results found for: LIPASE  Studies/Results: CT ABDOMEN PELVIS W CONTRAST  Result Date: 10/06/2020 CLINICAL DATA:  Left lower quadrant pain, constipation, abdomen feels rigid EXAM: CT ABDOMEN AND PELVIS WITH CONTRAST TECHNIQUE: Multidetector CT imaging of the abdomen and pelvis was performed using the standard protocol following bolus administration of intravenous contrast. CONTRAST:  24m OMNIPAQUE IOHEXOL 350 MG/ML SOLN COMPARISON:  CT abdomen and pelvis 07/26/2020 FINDINGS: Lower chest: No acute abnormality. Hepatobiliary: No focal liver abnormality is seen. No gallstones, gallbladder wall thickening, or biliary dilatation. Pancreas: Unremarkable. No pancreatic ductal dilatation or surrounding inflammatory changes. Spleen: Normal in size without focal abnormality. Adrenals/Urinary Tract: Adrenal glands are unremarkable. Kidneys are normal, without renal calculi, focal lesion, or hydronephrosis. Bladder is unremarkable. Stomach/Bowel: There are multiple dilated loops of small bowel, with a transition point in the lower central abdomen (series 2, image 52 and 63). The stomach is distended. The appendix is normal. Vascular/Lymphatic: Scattered aortoiliac atherosclerotic calcifications. No AAA. No lymphadenopathy.  Reproductive: Unremarkable. Other: No abdominal wall hernia or abnormality. No abdominopelvic ascites. No free intraperitoneal gas. Musculoskeletal: Multilevel degenerative changes of the spine. No acute osseous abnormality. No suspicious lytic or blastic lesions. IMPRESSION: Small-bowel obstruction with transition point in the lower central abdomen, likely due to adhesions. These results will be called to the ordering clinician or representative by the Radiologist Assistant, and  communication documented in the PACS or [employer]. Electronically Signed   By: Maurine Simmering M.D.   On: 10/06/2020 09:48   DG Abd Portable 1V-Small Bowel Obstruction Protocol-initial, 8 hr delay  Result Date: 10/07/2020 CLINICAL DATA:  Small bowel obstruction. EXAM: PORTABLE ABDOMEN - 1 VIEW COMPARISON:  KUB, 10/06/2020.  CT Abdomen Pelvis, 10/06/2020. FINDINGS: Support lines: Nasogastric tube, with tip appropriately within stomach. Persistent though improved appearance of air-and-fluid distended loops of small bowel. Annual contrast opacification of cecum and entirety of colon. Mild urinary bladder distention. No interval osseous abnormality. IMPRESSION: 1. Mildly improved appearance of small-bowel distention. Findings consistent with a small-bowel obstruction. 2. NG tube, appropriately positioned. Electronically Signed   By: Michaelle Birks M.D.   On: 10/07/2020 07:35   DG Abd Portable 1V-Small Bowel Protocol-Position Verification  Result Date: 10/06/2020 CLINICAL DATA:  Nasogastric tube placement EXAM: PORTABLE ABDOMEN - 1 VIEW COMPARISON:  CT abdomen 10/06/2020 FINDINGS: Nasogastric tube noted with side port at the gastroesophageal junction and distal tip in the gastric body. Lung bases appear clear. Lower thoracic spondylosis noted. There is some mildly dilated loops of small bowel up to about 4.0 cm in diameter. IMPRESSION: 1. Nasogastric tube tip is in the stomach body, with side port in the vicinity of the gastroesophageal junction. 2. Mildly dilated loops of small bowel. Electronically Signed   By: Van Clines M.D.   On: 10/06/2020 18:32    Anti-infectives: Anti-infectives (From admission, onward)    None        Assessment/Plan SBO - CT w/ SBO w/ transition point in the lower central abdomen. No prior abdominal surgical history - Patient symptomatically improved with no nausea, distension, or pain this am. He is NT on exam. He is passing flatus and having bm's. Contrast in  colon on xray. Clamp NGT and allow CLD. If tolerates clamping trial will plan for removal and advancement to FLD this PM.  - Keep K > 4 and Mg > 2 for bowel function - Mobilize for bowel function   FEN - Clamp NGT, CLD VTE - SCDs, okay for chemical prophylaxis from a general surgery standpoint  ID - None indicated from our standpoint   Per primary HTN   LOS: 0 days    Jillyn Ledger , [facility] 10/07/2020, 8:57 AM Please see Amion for pager number during day hours 7:00am-4:30pm

## 2020-10-07 NOTE — Plan of Care (Signed)

## 2020-10-07 NOTE — Progress Notes (Signed)
PROGRESS NOTE   Alexander Anderson.  [id number] DOB: 27-Aug-1945 DOA: 10/06/2020 PCP: Lawerance Cruel, MD   Brief Narrative:   57 white male known history HTN, testosterone deficiency, chronic PVC with normal stress Myoview 2008, prior SDH 04/2017, gout, chronic rhinitis, last colonoscopy 2016 Dr. Deatra Ina normal Admit with 2-day history of abdominal pain bloating emesis-found on CT to have SBO General surgery consulted placed on low intermittent suction  Hospital-Problem based course Small bowel obstruction Status post Gastrografin administration and has completely resolved passing gas and several stools since 8/31 Advance to regular diet DC a.m.? Reactive leukocytosis Has resolved since admission Normal Myoview chronic PVC, HTN Patient allergic to formulary substitution-can continue metoprolol 25 twice daily and resume his meds from home on discharge Other medical issues such as chronic rhinitis, testosterone deficiency gout etc. are all stable  DVT prophylaxis: SCD Code Status: Full Family Communication: None at bedside Disposition:  Status is: Inpatient  Remains inpatient appropriate because:Altered mental status and Unsafe d/c plan  Dispo: The patient is from: Home              Anticipated d/c is to: Home              Patient currently is not medically stable to d/c.   Difficult to place patient No       Consultants:  General surgery  Procedures: None  Antimicrobials:   Subjective: Awake coherent no distress sitting up seems comfortable no nausea no vomiting multiple stools no fever no chills  Objective: Vitals:   10/06/20 2001 10/07/20 0013 10/07/20 0444 10/07/20 0833  BP: (!) 147/86 (!) 159/94 (!) 141/85 (!) 148/89  Pulse: 96 92 89 92  Resp: '16 18 18 18  '$ Temp: 97.9 F (36.6 C) 97.7 F (36.5 C) 98.6 F (37 C) 98.6 F (37 C)  TempSrc: Oral Oral Oral Oral  SpO2: 97% 96% 97% 95%  Weight:      Height:        Intake/Output Summary (Last 24 hours)  at 10/07/2020 1244 Last data filed at 10/07/2020 1234 Gross per 24 hour  Intake 1752.96 ml  Output 400 ml  Net 1352.96 ml   Filed Weights   10/06/20 1057  Weight: 93 kg    Examination:  EOMI NCAT no focal deficit looks younger than stated age Neck soft supple S1-S2 no murmur chest clear no rales rhonchi Abdomen slightly distended not tender no rebound no guarding No lower extremity edema ROM intact  Data Reviewed: personally reviewed   CBC    Component Value Date/Time   WBC 10.1 10/07/2020 0257   RBC 5.51 10/07/2020 0257   HGB 17.3 (H) 10/07/2020 0257   HGB 15.8 03/23/2015 1528   HCT 51.2 10/07/2020 0257   HCT 46.5 03/23/2015 1528   PLT 200 10/07/2020 0257   PLT 207 03/23/2015 1528   MCV 92.9 10/07/2020 0257   MCV 92.3 03/23/2015 1528   MCH 31.4 10/07/2020 0257   MCHC 33.8 10/07/2020 0257   RDW 13.1 10/07/2020 0257   RDW 12.9 03/23/2015 1528   LYMPHSABS 1.4 10/06/2020 1228   LYMPHSABS 1.5 03/23/2015 1528   MONOABS 1.2 (H) 10/06/2020 1228   MONOABS 0.8 03/23/2015 1528   EOSABS 0.0 10/06/2020 1228   EOSABS 0.2 03/23/2015 1528   BASOSABS 0.1 10/06/2020 1228   BASOSABS 0.0 03/23/2015 1528   CMP Latest Ref Rng & Units 10/07/2020 10/06/2020 10/06/2020  Glucose 70 - 99 mg/dL 96 89 -  BUN 8 - 23 mg/dL  15 14 -  Creatinine 0.61 - 1.24 mg/dL 1.07 1.13 1.20  Sodium 135 - 145 mmol/L 136 134(L) -  Potassium 3.5 - 5.1 mmol/L 3.9 4.3 -  Chloride 98 - 111 mmol/L 102 98 -  CO2 22 - 32 mmol/L 22 24 -  Calcium 8.9 - 10.3 mg/dL 8.6(L) 9.2 -  Total Protein 6.5 - 8.1 g/dL - 7.4 -  Total Bilirubin 0.3 - 1.2 mg/dL - 1.8(H) -  Alkaline Phos 38 - 126 U/L - 63 -  AST 15 - 41 U/L - 28 -  ALT 0 - 44 U/L - 19 -     Radiology Studies: CT ABDOMEN PELVIS W CONTRAST  Result Date: 10/06/2020 CLINICAL DATA:  Left lower quadrant pain, constipation, abdomen feels rigid EXAM: CT ABDOMEN AND PELVIS WITH CONTRAST TECHNIQUE: Multidetector CT imaging of the abdomen and pelvis was performed using the  standard protocol following bolus administration of intravenous contrast. CONTRAST:  41m OMNIPAQUE IOHEXOL 350 MG/ML SOLN COMPARISON:  CT abdomen and pelvis 07/26/2020 FINDINGS: Lower chest: No acute abnormality. Hepatobiliary: No focal liver abnormality is seen. No gallstones, gallbladder wall thickening, or biliary dilatation. Pancreas: Unremarkable. No pancreatic ductal dilatation or surrounding inflammatory changes. Spleen: Normal in size without focal abnormality. Adrenals/Urinary Tract: Adrenal glands are unremarkable. Kidneys are normal, without renal calculi, focal lesion, or hydronephrosis. Bladder is unremarkable. Stomach/Bowel: There are multiple dilated loops of small bowel, with a transition point in the lower central abdomen (series 2, image 52 and 63). The stomach is distended. The appendix is normal. Vascular/Lymphatic: Scattered aortoiliac atherosclerotic calcifications. No AAA. No lymphadenopathy. Reproductive: Unremarkable. Other: No abdominal wall hernia or abnormality. No abdominopelvic ascites. No free intraperitoneal gas. Musculoskeletal: Multilevel degenerative changes of the spine. No acute osseous abnormality. No suspicious lytic or blastic lesions. IMPRESSION: Small-bowel obstruction with transition point in the lower central abdomen, likely due to adhesions. These results will be called to the ordering clinician or representative by the Radiologist Assistant, and communication documented in the PACS or [employer] Electronically Signed   By: JMaurine SimmeringM.D.   On: 10/06/2020 09:48   DG Abd Portable 1V-Small Bowel Obstruction Protocol-initial, 8 hr delay  Result Date: 10/07/2020 CLINICAL DATA:  Small bowel obstruction. EXAM: PORTABLE ABDOMEN - 1 VIEW COMPARISON:  KUB, 10/06/2020.  CT Abdomen Pelvis, 10/06/2020. FINDINGS: Support lines: Nasogastric tube, with tip appropriately within stomach. Persistent though improved appearance of air-and-fluid distended loops of small bowel.  Annual contrast opacification of cecum and entirety of colon. Mild urinary bladder distention. No interval osseous abnormality. IMPRESSION: 1. Mildly improved appearance of small-bowel distention. Findings consistent with a small-bowel obstruction. 2. NG tube, appropriately positioned. Electronically Signed   By: JMichaelle BirksM.D.   On: 10/07/2020 07:35   DG Abd Portable 1V-Small Bowel Protocol-Position Verification  Result Date: 10/06/2020 CLINICAL DATA:  Nasogastric tube placement EXAM: PORTABLE ABDOMEN - 1 VIEW COMPARISON:  CT abdomen 10/06/2020 FINDINGS: Nasogastric tube noted with side port at the gastroesophageal junction and distal tip in the gastric body. Lung bases appear clear. Lower thoracic spondylosis noted. There is some mildly dilated loops of small bowel up to about 4.0 cm in diameter. IMPRESSION: 1. Nasogastric tube tip is in the stomach body, with side port in the vicinity of the gastroesophageal junction. 2. Mildly dilated loops of small bowel. Electronically Signed   By: WVan ClinesM.D.   On: 10/06/2020 18:32     Scheduled Meds: Continuous Infusions:  sodium chloride 75 mL/hr at 10/06/20 1850  LOS: 0 days   Time spent: Latta, MD [facility] To contact the attending provider between 7A-7P or the covering provider during after hours 7P-7A, please log into the web site [email or web address] and access using universal [place] password for that web site. If you do not have the password, please call the hospital operator.  10/07/2020, 12:44 PM

## 2020-10-08 DIAGNOSIS — K565 Intestinal adhesions [bands], unspecified as to partial versus complete obstruction: Secondary | ICD-10-CM | POA: Diagnosis not present

## 2020-10-08 MED ORDER — HYDROCODONE-ACETAMINOPHEN 5-325 MG PO TABS: 1.0000 | ORAL_TABLET | ORAL | 0 refills | Status: AC | PRN

## 2020-10-08 NOTE — Discharge Instructions (Signed)
Please follow soft eating plan through 10/29/20 Thereafter follow high fiber eating plan

## 2020-10-08 NOTE — Plan of Care (Signed)

## 2020-10-08 NOTE — Final Consult Note (Signed)
Consultant Final Sign-Off Note    Assessment/Final recommendations  Alexander Ramones. is a 75 y.o. male followed by me for SBO  Contrast in colon on xray 9/1. Patient is tolerating regular diet without abdominal pain/distension, n/v. He had return of bowel function and is passing flatus w/ large reported bm yesterday. His abdomen is soft. Okay for discharge from our standpoint. We will sign off. Thank you for allowing [place] to participate in the care of your patient. Please consult [place] again if you have further needs for your patient.   Wound care (if applicable): N/A   Diet at discharge: recommend low residue/soft diet for ~3 weeks followed by a high fiber diet there after   Activity at discharge: per primary team   Follow-up appointment:  PRN   Pending results:  Unresulted Labs (From admission, onward)    None        Medication recommendations:   Other recommendations:     Barth Kirks [facility] 10/08/2020 8:07 AM    Subjective   Doing well. Tolerating regular diet (had 1/2 chicken, 1/3 rice, all of his peas, milk and apple juice) without reported abdominal pain/distension, n/v. Passing flatus. Large BM yesterday.   Objective  Vital signs in last 24 hours: Temp:  [98 F (36.7 C)-98.7 F (37.1 C)] 98 F (36.7 C) (09/02 0433) Pulse Rate:  [67-95] 67 (09/02 0433) Resp:  [17-18] 17 (09/02 0433) BP: (128-148)/(83-93) 128/83 (09/02 0433) SpO2:  [93 %-97 %] 96 % (09/02 0433)  Gen:  Alert, NAD, pleasant Pulm: Rate and effort normal  Abd: Soft, ND, NT +BS Psych: A&Ox3  Skin: no rashes noted, warm and dry   Pertinent labs and Studies: Recent Labs    10/06/20 1228 10/07/20 0257  WBC 10.8* 10.1  HGB 18.9* 17.3*  HCT 57.2* 51.2   BMET Recent Labs    10/06/20 1228 10/07/20 0257  NA 134* 136  K 4.3 3.9  CL 98 102  CO2 24 22  GLUCOSE 89 96  BUN 14 15  CREATININE 1.13 1.07  CALCIUM 9.2 8.6*   No results for input(s): LABURIN in the last 72 hours. Results for  orders placed or performed during the hospital encounter of 10/06/20  Resp Panel by RT-PCR (Flu A&B, Covid) Nasopharyngeal Swab     Status: None   Collection Time: 10/06/20 12:30 PM   Specimen: Nasopharyngeal Swab; Nasopharyngeal(NP) swabs in vial transport medium  Result Value Ref Range Status   SARS Coronavirus 2 by RT PCR NEGATIVE NEGATIVE Final    Comment: (NOTE) SARS-CoV-2 target nucleic acids are NOT DETECTED.  The SARS-CoV-2 RNA is generally detectable in upper respiratory specimens during the acute phase of infection. The lowest concentration of SARS-CoV-2 viral copies this assay can detect is 138 copies/mL. A negative result does not preclude SARS-Cov-2 infection and should not be used as the sole basis for treatment or other patient management decisions. A negative result may occur with  improper specimen collection/handling, submission of specimen other than nasopharyngeal swab, presence of viral mutation(s) within the areas targeted by this assay, and inadequate number of viral copies(<138 copies/mL). A negative result must be combined with clinical observations, patient history, and epidemiological information. The expected result is Negative.  Fact Sheet for Patients:  EntrepreneurPulse.com.au  Fact Sheet for Healthcare Providers:  IncredibleEmployment.be  This test is no t yet approved or cleared by the [place] FDA and  has been authorized for detection and/or diagnosis of SARS-CoV-2 by FDA under an Emergency Use  Authorization (EUA). This EUA will remain  in effect (meaning this test can be used) for the duration of the COVID-19 declaration under Section 564(b)(1) of the Act, 21 U.S.C.section 360bbb-3(b)(1), unless the authorization is terminated  or revoked sooner.       Influenza A by PCR NEGATIVE NEGATIVE Final   Influenza B by PCR NEGATIVE NEGATIVE Final    Comment: (NOTE) The Xpert Xpress SARS-CoV-2/FLU/RSV plus  assay is intended as an aid in the diagnosis of influenza from Nasopharyngeal swab specimens and should not be used as a sole basis for treatment. Nasal washings and aspirates are unacceptable for Xpert Xpress SARS-CoV-2/FLU/RSV testing.  Fact Sheet for Patients: EntrepreneurPulse.com.au  Fact Sheet for Healthcare Providers: IncredibleEmployment.be  This test is not yet approved or cleared by the [place] FDA and has been authorized for detection and/or diagnosis of SARS-CoV-2 by FDA under an Emergency Use Authorization (EUA). This EUA will remain in effect (meaning this test can be used) for the duration of the COVID-19 declaration under Section 564(b)(1) of the Act, 21 U.S.C. section 360bbb-3(b)(1), unless the authorization is terminated or revoked.  Performed at [facility], [place] [address], [place], [place] [address]     Imaging: No results found.

## 2020-10-08 NOTE — Progress Notes (Signed)
Discharge instructions explained and given to patient. IV removed. Patient declined wheelchair assistance off the unit and decided walk down himself to meet his wife who will be transporting him home.

## 2020-10-08 NOTE — Discharge Summary (Signed)
Physician Discharge Summary  Alexander Anderson. [id number] DOB: 17-Nov-1945 DOA: 10/06/2020  PCP: Lawerance Cruel, MD  Admit date: 10/06/2020 Discharge date: 10/08/2020  Time spent: 37 minutes  Recommendations for Outpatient Follow-up:  Needs Chem-12, CBC in the outpatient setting at PCP office 1 to 2 weeks Recommended outpatient follow-up with gastroenterology as had SBO without any prior abdominal surgery etc. and may need scope to see if there is anything else going on (it has been since 2016) Limited prescription of pain meds given on discharge--use Tylenol first choice hydrocodone second choice   Discharge Diagnoses:  MAIN problem for hospitalization   small bowel obstruction  Please see below for itemized issues addressed in [place]- refer to other progress notes for clarity if needed  Discharge Condition: Improved  Diet recommendation: As per general surgery--low residue soft diet for 3 weeks followed by high-fiber diet subsequently  [facility] Weights   10/06/20 1057  Weight: 93 kg    History of present illness:  86 white male known history HTN, testosterone deficiency, chronic PVC with normal stress Myoview 2008, prior SDH 04/2017, gout, chronic rhinitis, last colonoscopy 2016 Dr. Deatra Ina normal Admit with 2-day history of abdominal pain bloating emesis-found on CT to have SBO General surgery consulted placed on low intermittent suction and Gastrografin protocol He recovered relatively quickly and diet was graduated to soft diet low residue for several weeks post discharge  Hospital Course:  Small bowel obstruction Status post Gastrografin administration and has completely resolved passing gas and several stools since 8/31 Advance to regular diet He adds on discharge that he has been seeing a chiropractor been given some herbal medications and was told that he had an ileocecal valve dysfunction that the chiropractor was able to palpate-I do not know if this has any  specific bearing but he may require an outpatient colonoscopy in the next several months not emergently given his last one was in 2016 He is stabilized for discharged Reactive leukocytosis Has resolved since admission Normal Myoview chronic PVC, HTN Patient allergic to formulary substitution of our ARB- continue metoprolol 25 twice daily --meds were resumed on discharge  Other medical issues such as chronic rhinitis, testosterone deficiency gout etc. are all stable and he will need outpatient follow-up for these things  Discharge Exam: Vitals:   10/08/20 0433 10/08/20 0844  BP: 128/83 129/85  Pulse: 67 77  Resp: 17 19  Temp: 98 F (36.7 C) 98.5 F (36.9 C)  SpO2: 96% 95%    Subj on day of d/c   Awake coherent no distress EOMI NCAT no focal deficit ambulatory NG tube out tolerating multiple meals no chest pain no fever no chills Large stool last night  General Exam on discharge  Very pleasant alert coherent no distress EOMI NCAT no focal deficit CTA B no added sound no rales, rhonchi Abdomen soft no rebound no guarding S1-S2 no murmur no rub no gallop ROM intact Neurologically intact no focal deficit moving 4 limbs equally without any issue Psych euthymic congruent pleasant  Discharge Instructions   Discharge Instructions     Diet - low sodium heart healthy   Complete by: As directed    Discharge instructions   Complete by: As directed    Resume medications without specific change-we will prescribe in addition some oxycodone for abdominal pain not relieved by Tylenol 500 every 4 hours If you have significant nausea vomiting or inability to pass stool or gas-please represent to the emergency room Please make sure that you  follow-up in the outpatient setting with your gastroenterologist as they may want to consider a colonoscopy as it has been since 2016 that you have been seen Follow the restrictions on diet as per general surgery   Increase activity slowly   Complete  by: As directed       Allergies as of 10/08/2020       Reactions   Amlodipine Besylate    Other reaction(s): ankle edema   Ciprofloxacin    CAUSED SORE THROAT Other reaction(s): sore throat   Clomiphene    Other reaction(s): hot flashes   Irbesartan Cough   Oseltamivir    Other reaction(s): nausea, vomiting   Other Other (See Comments)   Penicillins    CAUSED FACE TO TURN RED AS CHILD.        Medication List     STOP taking these medications    glucosamine-chondroitin 500-400 MG tablet       TAKE these medications    cholecalciferol 1000 units tablet Commonly known as: VITAMIN D Take 1,000 Units by mouth daily.   Fish Oil 1000 MG Caps Take 1 capsule by mouth daily.   furosemide 20 MG tablet Commonly known as: LASIX Take 20 mg by mouth daily.   gabapentin 300 MG capsule Commonly known as: NEURONTIN Take 300 mg by mouth 3 (three) times daily.   HYDROcodone-acetaminophen 5-325 MG tablet Commonly known as: NORCO/VICODIN Take 1-2 tablets by mouth every 4 (four) hours as needed for moderate pain.   L-LYSINE PO Take 1 tablet by mouth daily.   metoprolol tartrate 25 MG tablet Commonly known as: LOPRESSOR Take 1 tablet (25 mg total) by mouth 2 (two) times daily. What changed: when to take this   milk thistle 175 MG tablet Take 175 mg by mouth daily.   testosterone cypionate 200 MG/ML injection Commonly known as: DEPOTESTOSTERONE CYPIONATE Inject 200 mg into the muscle every 14 (fourteen) days.   TURMERIC PO Take 1 tablet by mouth daily.   valsartan 320 MG tablet Commonly known as: DIOVAN Take 320 mg by mouth daily.   vitamin E 45 MG (100 UNITS) capsule Take 100 Units by mouth daily.       Allergies  Allergen Reactions   Amlodipine Besylate     Other reaction(s): ankle edema   Ciprofloxacin     CAUSED SORE THROAT Other reaction(s): sore throat   Clomiphene     Other reaction(s): hot flashes   Irbesartan Cough   Oseltamivir     Other  reaction(s): nausea, vomiting   Other Other (See Comments)   Penicillins     CAUSED FACE TO TURN RED AS CHILD.      The results of significant diagnostics from this hospitalization (including imaging, microbiology, ancillary and laboratory) are listed below for reference.    Significant Diagnostic Studies: CT ABDOMEN PELVIS W CONTRAST  Result Date: 10/06/2020 CLINICAL DATA:  Left lower quadrant pain, constipation, abdomen feels rigid EXAM: CT ABDOMEN AND PELVIS WITH CONTRAST TECHNIQUE: Multidetector CT imaging of the abdomen and pelvis was performed using the standard protocol following bolus administration of intravenous contrast. CONTRAST:  28m OMNIPAQUE IOHEXOL 350 MG/ML SOLN COMPARISON:  CT abdomen and pelvis 07/26/2020 FINDINGS: Lower chest: No acute abnormality. Hepatobiliary: No focal liver abnormality is seen. No gallstones, gallbladder wall thickening, or biliary dilatation. Pancreas: Unremarkable. No pancreatic ductal dilatation or surrounding inflammatory changes. Spleen: Normal in size without focal abnormality. Adrenals/Urinary Tract: Adrenal glands are unremarkable. Kidneys are normal, without renal calculi, focal lesion, or hydronephrosis.  Bladder is unremarkable. Stomach/Bowel: There are multiple dilated loops of small bowel, with a transition point in the lower central abdomen (series 2, image 52 and 63). The stomach is distended. The appendix is normal. Vascular/Lymphatic: Scattered aortoiliac atherosclerotic calcifications. No AAA. No lymphadenopathy. Reproductive: Unremarkable. Other: No abdominal wall hernia or abnormality. No abdominopelvic ascites. No free intraperitoneal gas. Musculoskeletal: Multilevel degenerative changes of the spine. No acute osseous abnormality. No suspicious lytic or blastic lesions. IMPRESSION: Small-bowel obstruction with transition point in the lower central abdomen, likely due to adhesions. These results will be called to the ordering clinician or  representative by the Radiologist Assistant, and communication documented in the PACS or [employer]. Electronically Signed   By: Maurine Simmering M.D.   On: 10/06/2020 09:48   DG Abd Portable 1V-Small Bowel Obstruction Protocol-initial, 8 hr delay  Result Date: 10/07/2020 CLINICAL DATA:  Small bowel obstruction. EXAM: PORTABLE ABDOMEN - 1 VIEW COMPARISON:  KUB, 10/06/2020.  CT Abdomen Pelvis, 10/06/2020. FINDINGS: Support lines: Nasogastric tube, with tip appropriately within stomach. Persistent though improved appearance of air-and-fluid distended loops of small bowel. Annual contrast opacification of cecum and entirety of colon. Mild urinary bladder distention. No interval osseous abnormality. IMPRESSION: 1. Mildly improved appearance of small-bowel distention. Findings consistent with a small-bowel obstruction. 2. NG tube, appropriately positioned. Electronically Signed   By: Michaelle Birks M.D.   On: 10/07/2020 07:35   DG Abd Portable 1V-Small Bowel Protocol-Position Verification  Result Date: 10/06/2020 CLINICAL DATA:  Nasogastric tube placement EXAM: PORTABLE ABDOMEN - 1 VIEW COMPARISON:  CT abdomen 10/06/2020 FINDINGS: Nasogastric tube noted with side port at the gastroesophageal junction and distal tip in the gastric body. Lung bases appear clear. Lower thoracic spondylosis noted. There is some mildly dilated loops of small bowel up to about 4.0 cm in diameter. IMPRESSION: 1. Nasogastric tube tip is in the stomach body, with side port in the vicinity of the gastroesophageal junction. 2. Mildly dilated loops of small bowel. Electronically Signed   By: Van Clines M.D.   On: 10/06/2020 18:32    Microbiology: Recent Results (from the past 240 hour(s))  Resp Panel by RT-PCR (Flu A&B, Covid) Nasopharyngeal Swab     Status: None   Collection Time: 10/06/20 12:30 PM   Specimen: Nasopharyngeal Swab; Nasopharyngeal(NP) swabs in vial transport medium  Result Value Ref Range Status   SARS  Coronavirus 2 by RT PCR NEGATIVE NEGATIVE Final    Comment: (NOTE) SARS-CoV-2 target nucleic acids are NOT DETECTED.  The SARS-CoV-2 RNA is generally detectable in upper respiratory specimens during the acute phase of infection. The lowest concentration of SARS-CoV-2 viral copies this assay can detect is 138 copies/mL. A negative result does not preclude SARS-Cov-2 infection and should not be used as the sole basis for treatment or other patient management decisions. A negative result may occur with  improper specimen collection/handling, submission of specimen other than nasopharyngeal swab, presence of viral mutation(s) within the areas targeted by this assay, and inadequate number of viral copies(<138 copies/mL). A negative result must be combined with clinical observations, patient history, and epidemiological information. The expected result is Negative.  Fact Sheet for Patients:  EntrepreneurPulse.com.au  Fact Sheet for Healthcare Providers:  IncredibleEmployment.be  This test is no t yet approved or cleared by the [place] FDA and  has been authorized for detection and/or diagnosis of SARS-CoV-2 by FDA under an Emergency Use Authorization (EUA). This EUA will remain  in effect (meaning this test can be used) for the  duration of the COVID-19 declaration under Section 564(b)(1) of the Act, 21 U.S.C.section 360bbb-3(b)(1), unless the authorization is terminated  or revoked sooner.       Influenza A by PCR NEGATIVE NEGATIVE Final   Influenza B by PCR NEGATIVE NEGATIVE Final    Comment: (NOTE) The Xpert Xpress SARS-CoV-2/FLU/RSV plus assay is intended as an aid in the diagnosis of influenza from Nasopharyngeal swab specimens and should not be used as a sole basis for treatment. Nasal washings and aspirates are unacceptable for Xpert Xpress SARS-CoV-2/FLU/RSV testing.  Fact Sheet for  Patients: EntrepreneurPulse.com.au  Fact Sheet for Healthcare Providers: IncredibleEmployment.be  This test is not yet approved or cleared by the [place] FDA and has been authorized for detection and/or diagnosis of SARS-CoV-2 by FDA under an Emergency Use Authorization (EUA). This EUA will remain in effect (meaning this test can be used) for the duration of the COVID-19 declaration under Section 564(b)(1) of the Act, 21 U.S.C. section 360bbb-3(b)(1), unless the authorization is terminated or revoked.  Performed at [facility], [place] [address] [place], [place] [address]      Labs: Basic Metabolic Panel: Recent Labs  Lab 10/06/20 0827 10/06/20 1228 10/06/20 1617 10/07/20 0257  NA  --  134*  --  136  K  --  4.3  --  3.9  CL  --  98  --  102  CO2  --  24  --  22  GLUCOSE  --  89  --  96  BUN  --  14  --  15  CREATININE 1.20 1.13  --  1.07  CALCIUM  --  9.2  --  8.6*  MG  --   --  2.3 2.2   Liver Function Tests: Recent Labs  Lab 10/06/20 1228  AST 28  ALT 19  ALKPHOS 63  BILITOT 1.8*  PROT 7.4  ALBUMIN 3.9   No results for input(s): LIPASE, AMYLASE in the last 168 hours. No results for input(s): AMMONIA in the last 168 hours. CBC: Recent Labs  Lab 10/06/20 1228 10/07/20 0257  WBC 10.8* 10.1  NEUTROABS 8.0*  --   HGB 18.9* 17.3*  HCT 57.2* 51.2  MCV 94.4 92.9  PLT 198 200   Cardiac Enzymes: No results for input(s): CKTOTAL, CKMB, CKMBINDEX, TROPONINI in the last 168 hours. BNP: BNP (last 3 results) No results for input(s): BNP in the last 8760 hours.  ProBNP (last 3 results) No results for input(s): PROBNP in the last 8760 hours.  CBG: No results for input(s): GLUCAP in the last 168 hours.     Signed:  Nita Sells MD   [facility] 10/08/2020, 9:36 AM

## 2020-10-15 ENCOUNTER — Other Ambulatory Visit: Payer: PPO | Admitting: *Deleted

## 2020-10-15 ENCOUNTER — Other Ambulatory Visit: Payer: Self-pay

## 2020-10-15 DIAGNOSIS — I77810 Thoracic aortic ectasia: Secondary | ICD-10-CM

## 2020-10-15 DIAGNOSIS — I10 Essential (primary) hypertension: Secondary | ICD-10-CM | POA: Diagnosis not present

## 2020-10-15 LAB — BASIC METABOLIC PANEL
BUN/Creatinine Ratio: 12 (ref 10–24)
BUN: 14 mg/dL (ref 8–27)
CO2: 25 mmol/L (ref 20–29)
Calcium: 9.4 mg/dL (ref 8.6–10.2)
Chloride: 96 mmol/L (ref 96–106)
Creatinine, Ser: 1.13 mg/dL (ref 0.76–1.27)
Glucose: 84 mg/dL (ref 65–99)
Potassium: 4.2 mmol/L (ref 3.5–5.2)
Sodium: 136 mmol/L (ref 134–144)
eGFR: 69 mL/min/{1.73_m2} (ref >59)

## 2020-10-16 ENCOUNTER — Other Ambulatory Visit: Payer: Self-pay | Admitting: Cardiovascular Disease

## 2020-10-18 DIAGNOSIS — E291 Testicular hypofunction: Secondary | ICD-10-CM | POA: Diagnosis not present

## 2020-10-19 ENCOUNTER — Ambulatory Visit
Admission: RE | Admit: 2020-10-19 | Discharge: 2020-10-19 | Disposition: A | Discharge status: Home / Self Care | Payer: PPO | Source: Ambulatory Visit | Attending: Otolaryngology | Admitting: Otolaryngology

## 2020-10-19 DIAGNOSIS — H905 Unspecified sensorineural hearing loss: Secondary | ICD-10-CM | POA: Diagnosis not present

## 2020-10-19 DIAGNOSIS — H903 Sensorineural hearing loss, bilateral: Secondary | ICD-10-CM

## 2020-10-19 MED ORDER — GADOBENATE DIMEGLUMINE 529 MG/ML IV SOLN
15.0000 mL | Freq: Once | INTRAVENOUS | Status: AC | PRN
  Administered 2020-10-19: 15 mL via INTRAVENOUS

## 2020-10-22 ENCOUNTER — Ambulatory Visit (INDEPENDENT_AMBULATORY_CARE_PROVIDER_SITE_OTHER)
Admission: RE | Admit: 2020-10-22 | Discharge: 2020-10-22 | Disposition: A | Discharge status: Home / Self Care | Payer: PPO | Source: Ambulatory Visit | Attending: Cardiovascular Disease | Admitting: Cardiovascular Disease

## 2020-10-22 ENCOUNTER — Other Ambulatory Visit: Payer: Self-pay

## 2020-10-22 DIAGNOSIS — I77810 Thoracic aortic ectasia: Secondary | ICD-10-CM | POA: Diagnosis not present

## 2020-10-22 DIAGNOSIS — I712 Thoracic aortic aneurysm, without rupture, unspecified: Secondary | ICD-10-CM

## 2020-10-22 DIAGNOSIS — R911 Solitary pulmonary nodule: Secondary | ICD-10-CM | POA: Diagnosis not present

## 2020-10-22 DIAGNOSIS — J8410 Pulmonary fibrosis, unspecified: Secondary | ICD-10-CM | POA: Diagnosis not present

## 2020-10-22 MED ORDER — IOHEXOL 350 MG/ML SOLN
100.0000 mL | Freq: Once | INTRAVENOUS | Status: AC | PRN
  Administered 2020-10-22: 100 mL via INTRAVENOUS

## 2020-10-25 DIAGNOSIS — M109 Gout, unspecified: Secondary | ICD-10-CM | POA: Diagnosis not present

## 2020-10-25 DIAGNOSIS — R899 Unspecified abnormal finding in specimens from other organs, systems and tissues: Secondary | ICD-10-CM | POA: Diagnosis not present

## 2020-10-25 DIAGNOSIS — Z09 Encounter for follow-up examination after completed treatment for conditions other than malignant neoplasm: Secondary | ICD-10-CM | POA: Diagnosis not present

## 2020-10-25 DIAGNOSIS — K56600 Partial intestinal obstruction, unspecified as to cause: Secondary | ICD-10-CM | POA: Diagnosis not present

## 2020-10-26 ENCOUNTER — Ambulatory Visit: Payer: PPO | Admitting: Neurology

## 2020-11-01 DIAGNOSIS — E291 Testicular hypofunction: Secondary | ICD-10-CM | POA: Diagnosis not present
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
## 2020-11-10 ENCOUNTER — Ambulatory Visit: Payer: PPO | Admitting: Cardiovascular Disease

## 2020-11-10 ENCOUNTER — Encounter: Payer: Self-pay | Admitting: Cardiovascular Disease

## 2020-11-10 ENCOUNTER — Other Ambulatory Visit: Payer: Self-pay

## 2020-11-10 VITALS — BP 114/74 | HR 93 | Ht 74.0 in | Wt 202.0 lb

## 2020-11-10 DIAGNOSIS — I340 Nonrheumatic mitral (valve) insufficiency: Secondary | ICD-10-CM

## 2020-11-10 DIAGNOSIS — I77810 Thoracic aortic ectasia: Secondary | ICD-10-CM

## 2020-11-10 DIAGNOSIS — I10 Essential (primary) hypertension: Secondary | ICD-10-CM | POA: Diagnosis not present

## 2020-11-10 DIAGNOSIS — I493 Ventricular premature depolarization: Secondary | ICD-10-CM

## 2020-11-10 NOTE — Patient Instructions (Signed)
Medication Instructions:  *If you need a refill on your cardiac medications before your next appointment, please call your pharmacy*  Lab Work: If you have labs (blood work) drawn today and your tests are completely normal, you will receive your results only by: [place] (if you have MyChart) OR A paper copy in the mail If you have any lab test that is abnormal or we need to change your treatment, we will call you to review the results.  Follow-Up: At [facility], you and your health needs are our priority.  As part of our continuing mission to provide you with exceptional heart care, we have created designated Provider Care Teams.  These Care Teams include your primary Cardiologist (physician) and Advanced Practice Providers (APPs -  Physician Assistants and Nurse Practitioners) who all work together to provide you with the care you need, when you need it.  We recommend signing up for the patient portal called "MyChart".  Sign up information is provided on this After Visit Summary.  MyChart is used to connect with patients for Virtual Visits (Telemedicine).  Patients are able to view lab/test results, encounter notes, upcoming appointments, etc.  Non-urgent messages can be sent to your provider as well.   To learn more about what you can do with MyChart, go to [email or web address].    Your next appointment:   12 month(s)  The format for your next appointment:   In Person  Provider:   You may see Jenkins Rouge, MD or one of the following Advanced Practice Providers on your designated Care Team:   Cecilie Kicks, NP

## 2020-11-15 DIAGNOSIS — E291 Testicular hypofunction: Secondary | ICD-10-CM | POA: Diagnosis not present

## 2020-11-29 DIAGNOSIS — E291 Testicular hypofunction: Secondary | ICD-10-CM | POA: Diagnosis not present

## 2020-12-13 DIAGNOSIS — E291 Testicular hypofunction: Secondary | ICD-10-CM | POA: Diagnosis not present

## 2021-01-03 DIAGNOSIS — D751 Secondary polycythemia: Secondary | ICD-10-CM | POA: Diagnosis not present

## 2021-01-03 DIAGNOSIS — E291 Testicular hypofunction: Secondary | ICD-10-CM | POA: Diagnosis not present

## 2021-01-03 DIAGNOSIS — R972 Elevated prostate specific antigen [PSA]: Secondary | ICD-10-CM | POA: Diagnosis not present

## 2021-01-04 ENCOUNTER — Telehealth: Payer: Self-pay | Admitting: *Deleted

## 2021-01-04 ENCOUNTER — Encounter: Payer: Self-pay | Admitting: Neurology

## 2021-01-04 ENCOUNTER — Ambulatory Visit: Payer: PPO | Admitting: Neurology

## 2021-01-04 VITALS — BP 143/76 | HR 75 | Ht 74.0 in | Wt 204.8 lb

## 2021-01-04 DIAGNOSIS — D225 Melanocytic nevi of trunk: Secondary | ICD-10-CM | POA: Diagnosis not present

## 2021-01-04 DIAGNOSIS — R413 Other amnesia: Secondary | ICD-10-CM | POA: Diagnosis not present

## 2021-01-04 DIAGNOSIS — L821 Other seborrheic keratosis: Secondary | ICD-10-CM | POA: Diagnosis not present

## 2021-01-04 DIAGNOSIS — D2261 Melanocytic nevi of right upper limb, including shoulder: Secondary | ICD-10-CM | POA: Diagnosis not present

## 2021-01-04 DIAGNOSIS — L738 Other specified follicular disorders: Secondary | ICD-10-CM | POA: Diagnosis not present

## 2021-01-04 DIAGNOSIS — D2271 Melanocytic nevi of right lower limb, including hip: Secondary | ICD-10-CM | POA: Diagnosis not present

## 2021-01-04 DIAGNOSIS — D2272 Melanocytic nevi of left lower limb, including hip: Secondary | ICD-10-CM | POA: Diagnosis not present

## 2021-01-04 NOTE — Patient Instructions (Addendum)
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 Harrington Challenger please Lacunar Stroke A lacunar stroke (lacunar infarction) happens when an area of the brain does not get enough oxygen and blood flow. This can lead to permanent brain damage. A lacunar stroke is a medical emergency. It must be treated right away. What are the causes? This condition is caused by a blockage in a small artery deep in the brain. This may be due to: A buildup of fatty deposits that causes the arteries to narrow (atherosclerosis). This blocks blood flow in the brain. High blood pressure. What increases the risk? You are more likely to develop this condition if you: Have high blood pressure (hypertension). Have high cholesterol (hyperlipidemia). Smoke. Have diabetes. Have heart disease or artery disease, such as carotid artery disease or peripheral artery disease. Are age 22 or older. Have a personal or family history of stroke. What are the signs or symptoms? Symptoms of this condition usually develop suddenly. They may include: Weakness or numbness in your face, arm, or leg, especially on one side of your body. Trouble walking or moving your arms or legs. Loss of balance or coordination. Slurred speech. Vision problems. Dizziness. Nausea and vomiting. Severe headache. How is this diagnosed? This condition may be diagnosed based on: Your symptoms and medical history. A physical exam. Blood tests. A CT scan or MRI of the brain. How is this treated? This condition must be treated within 3-4 hours of the start of the stroke. The goal is to restore blood flow to the brain as soon as possible. Treatments may include: Medicine given through an IV to dissolve the blood clot. Blood thinners (antiplatelets). Medicines to control blood pressure. Your health care provider may prescribe blood thinners to lower  your risk of another stroke. Follow these instructions at home: Medicines Take over-the-counter and prescription medicines only as told by your health care provider. If you were told to take a medicine to thin your blood, such as aspirin, take it exactly as told by your health care provider. Taking too much blood-thinning medicine can cause bleeding. Taking too little may not protect you against a stroke and other problems. Eating and drinking Follow instructions from your health care provider about diet. Eat healthy foods. This includes plenty of fruits and vegetables, lean meats, whole grains, and low-fat dairy products. Avoid foods high in saturated fat, trans fat, or sodium. If you drink alcohol: Limit how much you have to: 0-1 drink a day for women. 0-2 drinks a day for men. Know how much alcohol is in your drink. In the [place]., one drink equals 12 oz of beer, 5 oz of wine, or 1 oz of hard liquor. Safety If you need help walking, use a cane or walker as told by your health care provider. Take steps to lower the risk of falls in your home. This may include: Using raised toilets and a seat in the shower. Removing clutter and tripping hazards, such as cords or area rugs. Installing grab bars in the bedroom and bathroom. Activity Exercise regularly, as told by your health care provider. Take part in rehabilitation programs as told by your health care provider. This may include physical therapy, occupational therapy, or speech therapy. General instructions Do not use any products that contain nicotine or tobacco. These products include cigarettes, chewing tobacco, and vaping devices, such as e-cigarettes. If you need  help quitting, ask your health care provider. Keep all follow-up visits. This is important. Get help right away if:  You have any symptoms of stroke. "BE FAST" is an easy way to remember the main warning signs of stroke: B - Balance. Signs are dizziness, sudden trouble  walking, or loss of balance. E - Eyes. Signs are trouble seeing or a sudden change in vision. F - Face. Signs are sudden weakness or numbness of the face, or the face or eyelid drooping on one side. A - Arms. Signs are weakness or numbness in an arm. This happens suddenly and usually on one side of the body. S - Speech. Signs are sudden trouble speaking, slurred speech, or trouble understanding what people say. T - Time. Time to call emergency services. Write down what time symptoms started. You have other signs of stroke, such as: A sudden, severe headache. Nausea or vomiting. Seizure. You have a severe fall or injury. These symptoms may represent a serious problem that is an emergency. Do not wait to see if the symptoms will go away. Get medical help right away. Call your local emergency services (911 in the U.S.). Do not drive yourself to the hospital. Summary A lacunar stroke (lacunar infarction) happens when an area of the brain does not get enough oxygen. This can lead to permanent brain damage. This condition is a medical emergency that must be treated right away. Treatments must be done within 3-4 hours of the start of the stroke. Controlling your risk factors for stroke is the best way to avoid another lacunar stroke. Get help right away if you have any symptoms of stroke. "BE FAST" is an easy way to remember the main warning signs of stroke. This information is not intended to replace advice given to you by your health care provider. Make sure you discuss any questions you have with your health care provider. Document Revised: 08/14/2019 Document Reviewed: 08/14/2019 Elsevier Patient Education  [place].   Recommendations to prevent or slow progression of cognitive decline:   Exercise You should increase exercise 30 to 45 minutes per day at least 3 days a week although 5 to 7 would be preferred. Any type of exercise (including walking) is acceptable although a recumbent  bicycle may be best if you are unsteady. Disease related apathy can be a significant roadblock to exercise and the only way to overcome this is to make it a daily routine and perhaps have a reward at the end (something your loved one loves to eat or drink perhaps) or a personal trainer coming to the home can also be very useful. In general a structured, repetitive schedule is best.   Cardiovascular Health: You should optimize all cardiovascular risk factors (blood pressure, sugar, cholesterol) as vascular disease such as strokes and heart attacks can make memory problems much worse.   Diet: Eating a heart healthy (Mediterranean) diet is also a good idea; fish and poultry instead of red meat, nuts (mostly non-peanuts), vegetables, fruits, olive oil or canola oil (instead of butter), minimal salt (use other spices to flavor foods), whole grain rice, bread, cereal and pasta and wine in moderation.  General Health: Any diseases which effect your body will effect your brain such as a pneumonia, urinary infection, blood clot, heart attack or stroke. Keep contact with your primary care doctor for regular follow ups.  Sleep. A good nights sleep is healthy for the brain. Seven hours is recommended. If you have insomnia or poor sleep habits  see the recommendations below  Tips: Structured and consistent daytime and nighttime routine, including regular wake times, bedtimes, and mealtimes, will be important for the patient to avoid confusion. Keeping frequently used items in designated places will help reduce stress from searching. If there are worries about getting lost do not let the patient leave home unaccompanied. They might benefit from wearing an identification bracelet that will help others assist in finding home if they become lost. Information about nationwide safe return services and other helpful resources may be obtained through the Alzheimer's Association helpline at [phone].  Finances, Power of  Producer, television/film/video Directives: You should consider putting legal safeguards in place with regard to financial and medical decision making. While the spouse always has power of attorney for medical and financial issues in the absence of any form, you should consider what you want in case the spouse / caregiver is no longer around or capable of making decisions.   [place] : http://www.welch.com/.pdf  Or Google "[place]" AND "An Forensic scientist for [employer]  Other States: ApartmentMom.com.ee  The signature on these forms should be notarized.   DRIVING:   Driving only during the day Drive only to familiar Locations Avoid driving during bad weather  If you would like to be tested to see if you are driving safely, [facility] has a Clinical Driving Evaluation. To schedule an appointment call [phone].                RESOURCES:  Memory Loss: Improve your short term memory By Silvio Pate  The Alzheimer's Reading Room http://www.alzheimersreadingroom.com/   The Alzheimer's Compendium http://www.alzcompend.info/  [employer] www.dukefamilysupport.DVV [phone]  Recommended resources for caregivers (All can be purchased on [employer]):  1) A Caregiver's Guide to Dementia: Using Activities and Other Strategies to Prevent, Reduce and Manage Behavioral Symptoms by Osie Bond. Tyler Aas and [employer]   2) A Caregiver's Guide to [employer] Dementia by Caleen Essex MS BSN and Gaston Islam   3) What If It's Not Alzheimer's?: A Caregiver's Guide to Dementia by Koren Shiver (Author), Octaviano Batty (Editor)  3) The 36 hour day by Rabins and Mace  4) Understanding Difficult Behaviors by Merita Norton and White  Online course for helping caregivers reduce stress, guilt and frustration called the Caregivers  Helpbook. The website is www.powerfultoolsforcaregivers.org  As a caregiver you are a Art gallery manager. Problems you face as a caregiver are usually unique to your situation and the way your loved-one's disease manifests itself. The best way to use these books is to look at the Table of Contents and read any chapters of interest or that apply to challenges you are having as a caregiver.  NATIONAL RESOURCES: For more information on neurological disorders or research programs funded by the [employer] of Neurological Disorders and Stroke, contact the Institute's Agricultural consultant (BRAIN) at: [place] [address] [phone] (toll-free) MasterBoxes.it  Information on dementia is also available from the following organizations: Alzheimer's Disease Education and Referral ([place]) [place] on Aging [address] [place] [address] [phone] (toll-free) DVDEnthusiasts.nl  Alzheimer's Association [address] [place] [address] [phone] (toll-free, 24-hour helpline) [phone] (TDD) CapitalMile.co.nz  Alzheimer's Foundation of America [address], [place] [address] [phone] (toll-free) www.alzfdn.org  Alzheimer's [facility] [address], [place] [address] [phone] www.alzdiscovery.org  Association for [facility] [address]  [address] [place] [address] [phone] (toll-free) www[place] [address] [phone] (toll-free) www.brightfocus.org/alzheimers  Doran Stabler [facility] [address], [place] [address] [phone] www.https://lambert-jackson.net/  Lewy Body Dementia Association [address], [place]  [address] [phone] (toll-free LBD Caregiver Link) www.lbda.[place], [place], [place] [address] [phone] (toll-free) [phone] [facility]) https://carter.com/  National Organization for Rare Disorders [address] [place] [address] [phone]) (toll-free) www.rarediseases.org  The Dementias: Hope Through Research was jointly produced by the [employer] of Neurological Disorders and Stroke (NINDS) and the [employer] on Aging (NIA), both part of the [employer], the [employer] research agency--supporting scientific studies that turn discovery into health. NINDS is the nation's leading funder of research on the brain and nervous system. The NINDS mission is to reduce the burden of neurological disease. For more information and resources, visit MasterBoxes.it [1] or call [phone]. NIA leads the federal government effort conducting and supporting research on aging and the health and well-being of older people. NIA's Alzheimer's Disease Education and Referral (ADEAR) Center offers information and publications on dementia and caregiving for families, caregivers, and professionals. For more information, visit DVDEnthusiasts.nl [2] or call ([phone]) [phone]. Also available from NIA are publications and information about Alzheimer's disease as well as the booklets Frontotemporal Disorders: Information for Patients, Families, and Caregivers and Lewy Body Dementia: Information for Patients, Families, and Professionals. Source URL: SocialSpecialists.co.nz

## 2021-01-04 NOTE — Telephone Encounter (Signed)
I called pt and relayed Dr. Cathren Laine message.  Re: confirmed lacunar infarc buy our neuro radiologist.  Take aspirin 81mg  daily.  She will send [employer].  He is pending in mychart at this time.  He is to call back as well.

## 2021-01-04 NOTE — Progress Notes (Addendum)
[id number]    Provider:  Dr Jaynee Eagles Requesting Provider: Lawerance Cruel, MD Primary Care Provider:  Lawerance Cruel, MD  CC:  short-term memory loss  HPI:  Alexander Anderson. is a 75 y.o. male here as requested by Lawerance Cruel, MD for hx of subdural hematoma. Nothing is concerning him. His family and his wife think he is not remembering things like her used to. He doesn't think he has a problems. But they feel his shortterm memory is not what it used to be, his kids are grown, he has grandchildren, he doesn't think he is paying as much attention to the little details he used. He has some anxiety, minor and not really affecting, one glass of wine an evening, no significant hx of alcohol or drug abuse, no depression, no hx of dementia in the family, no accidents, he rides a motorcycle, very active, very social, not getting lost, IADLS, ADLS.No other focal neurologic deficits, associated symptoms, inciting events or modifiable factors.  Reviewed notes, labs and imaging from outside physicians, which showed:  MRI brain 10/19/2020: reviewed images IMPRESSION: No evidence of acute intracranial abnormality.   No cerebellopontine angle or internal auditory canal mass.   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   Background mild chronic small vessel ischemic changes within the cerebral white matter.  (I am having a hard time seeing the Chronic lacunar infarct within the right corona radiata. Will review with neuroradiologist)  Reviewed records, in 2019 had a skiing accident with SDH. Reviewed CTs, resolved  Review of Systems: Patient complains of symptoms per HPI as well as the following symptoms short-term memory loss. Pertinent negatives and positives per HPI. All others negative.   Social History   Socioeconomic History   Marital status: Married    Spouse name: Not on file   Number of children: Not on file   Years of education: Not on  file   Highest education level: Not on file  Occupational History   Not on file  Tobacco Use   Smoking status: Never   Smokeless tobacco: Never  Substance and Sexual Activity   Alcohol use: Yes    Alcohol/week: 7.0 standard drinks    Types: 7 Standard drinks or equivalent per week   Drug use: No   Sexual activity: Not on file  Other Topics Concern   Not on file  Social History Narrative   Not on file   Social Determinants of Health   Financial Resource Strain: Not on file  Food Insecurity: Not on file  Transportation Needs: Not on file  Physical Activity: Not on file  Stress: Not on file  Social Connections: Not on file  Intimate Partner Violence: Not on file    Family History  Problem Relation Age of Onset   Atrial fibrillation Mother    Ovarian cancer Mother    Heart disease Sister     Past Medical History:  Diagnosis Date   Adrenal insufficiency ([place])    Cataract    Dysplastic nevus    [name]   HTN (hypertension)    Benign   Hypercholesterolemia    Low testosterone    Lower back pain    PVC's (premature ventricular contractions)    STRESS TEST IN [facility]   Subdural hematoma 2019    Patient Active Problem List   Diagnosis Date Noted   SBO (small bowel obstruction) ([place]) 10/07/2020   Gout 10/06/2020   Small bowel obstruction due to adhesions (  [place]) 10/06/2020   Polycythemia 10/06/2020   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     furosemide (LASIX) 20 MG tablet Take 20 mg by mouth daily.     gabapentin (NEURONTIN) 300 MG capsule Take 300 mg by mouth daily.     HYDROcodone-acetaminophen (NORCO/VICODIN) 5-325 MG tablet Take 1-2 tablets by mouth every 4 (four) hours as needed for moderate pain. 30 tablet 0   L-LYSINE PO Take 1 tablet by mouth daily.      metoprolol tartrate (LOPRESSOR) 25 MG tablet Take 25 mg by mouth daily.     milk thistle 175 MG tablet Take 175 mg by mouth daily.     Misc Natural Products (GLUCOSAMINE CHOND CMP ADVANCED PO) Take by mouth.     Omega-3 Fatty Acids (FISH OIL) 1000 MG CAPS Take 1 capsule by mouth daily.      testosterone cypionate (DEPOTESTOSTERONE CYPIONATE) 200 MG/ML injection Inject 200 mg into the muscle every 14 (fourteen) days.     TURMERIC PO Take 1 tablet by mouth daily. 1000mg  daily     valsartan (DIOVAN) 320 MG tablet Take 320 mg by mouth daily.     vitamin E 100 UNIT capsule Take 100 Units by mouth daily.      No current facility-administered medications for this visit.    Allergies as of 01/04/2021 - Review Complete 01/04/2021  Allergen Reaction Noted   Amlodipine besylate  10/15/2019   Ciprofloxacin  04/23/2013   Clomiphene  10/15/2019   Irbesartan Cough 10/15/2019   Oseltamivir  10/15/2019   Other Other (See Comments) 10/15/2019   Penicillins  04/23/2013    Vitals: BP (!) 143/76   Pulse 75   Ht 6\' 2"  (1.88 m)   Wt 204 lb 12.8 oz (92.9 kg)   BMI 26.29 kg/m  Last Weight:  Wt Readings from Last 1 Encounters:  01/04/21 204 lb 12.8 oz (92.9 kg)   Last Height:   Ht Readings from Last 1 Encounters:  01/04/21 6\' 2"  (1.88 m)     Physical exam: Exam: Gen: NAD, conversant, well nourised, well groomed                     CV: RRR, no MRG. No Carotid Bruits. No peripheral edema, warm, nontender Eyes: Conjunctivae clear without exudates or hemorrhage  Neuro: Detailed Neurologic Exam  Speech:    Speech is  normal; fluent and spontaneous with normal comprehension.  Cognition:    The patient is oriented to person, place, and time;     recent and remote memory intact;     language fluent;     normal attention, concentration,     fund of knowledge Cranial Nerves:  MMSE - Mini Mental State Exam 01/04/2021  Orientation to time 5  Orientation to Place 5  Registration 3  Attention/ Calculation  5  Recall 3  Language- name 2 objects 2  Language- repeat 1  Language- follow 3 step command 3  Language- read & follow direction 1  Write a sentence 1  Copy design 1  Total score 30       The pupils are equal, round, and reactive to light. Pupils too small to visualize fundi. Visual fields are full to finger confrontation. Extraocular movements are intact. Trigeminal sensation is intact and the muscles of mastication are normal. The face is symmetric. The palate elevates in the midline. Hearing intact. Voice is normal. Shoulder shrug is normal. The tongue has normal motion without fasciculations.   Coordination:    Normal   Gait:    normal.   Motor Observation:    No asymmetry, no atrophy, and no involuntary movements noted. Tone:    Normal muscle tone.    Posture:    Posture is normal. normal erect    Strength:    Strength is V/V in the upper and lower limbs.      Sensation: intact to LT     Reflex Exam:  DTR's:    Deep tendon reflexes in the upper and lower extremities are normal bilaterally.   Toes:    The toes are downgoing bilaterally.   Clonus:    Clonus is absent.    Assessment/Plan:  75 year old male with concerns of short-term memory loss. MMSe 30/30. I don't perceive any striking symptoms of neurodegenerative disorder but needs a work up due to symptoms and concerns from family and pcp.  MRI brain: Chronic lacunar infarct? I am having a hard time seeing the Chronic lacunar infarct within the right corona radiata. Will review with neuroradiologist. If there recommend ASA  81mg  daily and management of vascular risk at ldl < 70 and other vascular factors are controlled.  Addendum: confirmed with neuroradiology. Start ASA 81mg . I had a long d/w patient about his  recent stroke, risk for recurrent stroke/TIAs, personally independently reviewed imaging studies and stroke evaluation results and answered questions.Continue asa for secondary stroke prevention and maintain strict control of hypertension with blood pressure goal below 130/90, diabetes with hemoglobin A1c goal below 6.5% and lipids with LDL cholesterol goal below 70 mg/dL.Patient has h/o statin intolerance hence recommend new PCSK9 inhibitor like Praluent.   Blood work Building control surveyor for formal neurocognitive testing: if anything cincerning, can follow up with patient for further testing [facility] brain study - consider MIND diet [employer]  Discussed: Recommendations to prevent or slow progression of cognitive decline: see AVS  Orders Placed This Encounter  Procedures   B12 and Folate Panel   Methylmalonic acid, serum   Vitamin B1   Homocysteine   TSH   RPR   Ambulatory referral to Neuropsychology   No orders of the defined types were placed in this encounter.   Cc: Lawerance Cruel, MD,  Lawerance Cruel, MD  Sarina Ill, MD  [facility] [address] [place], [place] [address]  Phone [phone] Fax [phone]

## 2021-01-04 NOTE — Telephone Encounter (Signed)
-----   Message from Melvenia Beam, MD sent at 01/04/2021  3:05 PM EST ----- Please call patient. Our neuroradiologist confirmed a "lacunar infarct". Patient should be on daily baby aspirin. Ask him to check his mychart messages, I will send him an explanation of what this is. thanks   ----- Message ----- From: Melvenia Beam, MD Sent: 01/04/2021  12:08 PM EST To: Melvenia Beam, MD, Britt Bottom, MD  Richard, would you mind taking a look at this MRI? 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 Can you? thanks

## 2021-01-05 DIAGNOSIS — E7800 Pure hypercholesterolemia, unspecified: Secondary | ICD-10-CM | POA: Diagnosis not present

## 2021-01-05 DIAGNOSIS — I10 Essential (primary) hypertension: Secondary | ICD-10-CM | POA: Diagnosis not present

## 2021-01-07 LAB — HOMOCYSTEINE: Homocysteine: 15.3 umol/L (ref 0.0–19.2)

## 2021-01-07 LAB — B12 AND FOLATE PANEL
Folate: 9.6 ng/mL (ref >3.0)
Vitamin B-12: 396 pg/mL (ref 232–1245)

## 2021-01-07 LAB — METHYLMALONIC ACID, SERUM: Methylmalonic Acid: 170 nmol/L (ref 0–378)

## 2021-01-07 LAB — RPR: RPR Ser Ql: NONREACTIVE

## 2021-01-07 LAB — TSH: TSH: 1.34 u[IU]/mL (ref 0.450–4.500)

## 2021-01-07 LAB — VITAMIN B1: Thiamine: 135.6 nmol/L (ref 66.5–200.0)

## 2021-01-10 ENCOUNTER — Telehealth: Payer: Self-pay | Admitting: *Deleted

## 2021-01-10 NOTE — Telephone Encounter (Signed)
Spoke and gave patient lab results this am  Pt thanked me for calling

## 2021-01-10 NOTE — Telephone Encounter (Signed)
Placed in mail.  (AVS/Lacunar Infarct info).

## 2021-01-10 NOTE — Telephone Encounter (Signed)
I called pt.  I relayed that Dr. Jaynee Eagles wanted to let him know that the MRI did confirm a chronic lacunar infarction per our neuroradiologist.  She wanted him to take a 81mg  aspirin (with food) and LDL cholesterol be less then 70.  He did not know what his LDL was and has appt with Dr. Harrington Challenger (pcp) would let us know.  Would like info mailed to him on Lacunar Infarct. Vs mychart.

## 2021-01-10 NOTE — Telephone Encounter (Signed)
-----   Message from Melvenia Beam, MD sent at 01/05/2021 10:48 AM EST ----- See prior message. Patient does not have mychart set up. If he does I can send him info on lacunar strokes. He should be on a daily baby aspirin and make sure his cholesterol is ldl < 70  ----- Message ----- From: Melvenia Beam, MD Sent: 01/04/2021  12:08 PM EST To: Melvenia Beam, MD, Britt Bottom, MD  Richard, would you mind taking a look at this MRI? 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 Can you? thanks

## 2021-01-11 DIAGNOSIS — I679 Cerebrovascular disease, unspecified: Secondary | ICD-10-CM | POA: Diagnosis not present

## 2021-01-11 DIAGNOSIS — E291 Testicular hypofunction: Secondary | ICD-10-CM | POA: Diagnosis not present

## 2021-01-11 DIAGNOSIS — R202 Paresthesia of skin: Secondary | ICD-10-CM | POA: Diagnosis not present

## 2021-01-11 DIAGNOSIS — E7800 Pure hypercholesterolemia, unspecified: Secondary | ICD-10-CM | POA: Diagnosis not present

## 2021-01-13 ENCOUNTER — Other Ambulatory Visit: Payer: Self-pay | Admitting: Cardiovascular Disease

## 2021-01-17 DIAGNOSIS — E291 Testicular hypofunction: Secondary | ICD-10-CM | POA: Diagnosis not present

## 2021-01-26 DIAGNOSIS — M4696 Unspecified inflammatory spondylopathy, lumbar region: Secondary | ICD-10-CM | POA: Diagnosis not present

## 2021-02-01 DIAGNOSIS — E291 Testicular hypofunction: Secondary | ICD-10-CM | POA: Diagnosis not present

## 2021-02-10 DIAGNOSIS — M4696 Unspecified inflammatory spondylopathy, lumbar region: Secondary | ICD-10-CM | POA: Diagnosis not present

## 2021-02-21 DIAGNOSIS — G5603 Carpal tunnel syndrome, bilateral upper limbs: Secondary | ICD-10-CM | POA: Diagnosis not present

## 2021-02-21 DIAGNOSIS — M65332 Trigger finger, left middle finger: Secondary | ICD-10-CM | POA: Diagnosis not present

## 2021-02-22 DIAGNOSIS — R200 Anesthesia of skin: Secondary | ICD-10-CM | POA: Diagnosis not present

## 2021-02-22 DIAGNOSIS — E291 Testicular hypofunction: Secondary | ICD-10-CM | POA: Diagnosis not present

## 2021-03-01 DIAGNOSIS — M4696 Unspecified inflammatory spondylopathy, lumbar region: Secondary | ICD-10-CM | POA: Diagnosis not present

## 2021-03-02 DIAGNOSIS — G5602 Carpal tunnel syndrome, left upper limb: Secondary | ICD-10-CM | POA: Diagnosis not present

## 2021-03-02 DIAGNOSIS — G5601 Carpal tunnel syndrome, right upper limb: Secondary | ICD-10-CM | POA: Diagnosis not present

## 2021-03-07 DIAGNOSIS — G5603 Carpal tunnel syndrome, bilateral upper limbs: Secondary | ICD-10-CM | POA: Diagnosis not present

## 2021-03-09 DIAGNOSIS — E291 Testicular hypofunction: Secondary | ICD-10-CM | POA: Diagnosis not present

## 2021-03-15 ENCOUNTER — Telehealth: Payer: Self-pay | Admitting: Cardiovascular Disease

## 2021-03-15 NOTE — Telephone Encounter (Signed)
° °  [facility] Pre-operative Risk Assessment    Request for surgical clearance:  What type of surgery is being performed?  Right Carpal Tunnel Release   When is this surgery scheduled?  04/14/21   What type of clearance is required (medical clearance vs. Pharmacy clearance to hold med vs. Both)?  Both   Are there any medications that need to be held prior to surgery and how long? Their office is requesting our recommendation regarding medications   Practice name and name of physician performing surgery?  [facility]   Dr. [name]   What is your office phone number? [phone]    7.   What is your office fax number? [phone]  8.   Anesthesia type (None, local, MAC, general) ?  [place] 03/15/2021, 3:53 PM

## 2021-03-16 NOTE — Telephone Encounter (Signed)
° °  Name: Alexander Rea.  DOB: 1945-10-29  MRN: [id number]   Primary Cardiologist: Jenkins Rouge, MD  Chart reviewed as part of pre-operative protocol coverage. Patient was contacted 03/16/2021 in reference to pre-operative risk assessment for pending surgery as outlined below.  Alexander Rea. was last seen on 11/2020 by 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 Echo 10/2019 with EF 60-65%. Remote nuc 2008 felt to be low risk. Felt to be doing well at last OV, to follow up in 12 months. I reached out to patient for update on how he is doing. The patient affirms he has been doing well without any new cardiac symptoms. He is able to exert over 4 METS without chest pain or dyspnea. He is sometimes limited by spinal stenosis but when his back problem is not flaring up, he is able to work actively in the yard, carrying things and chopping wood, and walks regularly. Therefore, based on ACC/AHA guidelines, the patient would be at acceptable risk for the planned procedure without further cardiovascular testing. The patient was advised that if he develops new symptoms prior to surgery to contact our office to arrange for a follow-up visit, and he verbalized understanding. He is not on any blood thinners per our [facility] that he would need to hold from cardiac standpoint.  I will route this recommendation to the requesting party via Epic fax function and remove from pre-op pool. Please call with questions.  Charlie Pitter, PA-C 03/16/2021, 10:38 AM

## 2021-03-22 DIAGNOSIS — N528 Other male erectile dysfunction: Secondary | ICD-10-CM | POA: Diagnosis not present

## 2021-03-23 DIAGNOSIS — M47816 Spondylosis without myelopathy or radiculopathy, lumbar region: Secondary | ICD-10-CM | POA: Diagnosis not present

## 2021-04-04 DIAGNOSIS — E291 Testicular hypofunction: Secondary | ICD-10-CM | POA: Diagnosis not present

## 2021-04-12 DIAGNOSIS — H26492 Other secondary cataract, left eye: Secondary | ICD-10-CM | POA: Diagnosis not present

## 2021-04-14 DIAGNOSIS — G5601 Carpal tunnel syndrome, right upper limb: Secondary | ICD-10-CM | POA: Diagnosis not present

## 2021-04-19 DIAGNOSIS — E291 Testicular hypofunction: Secondary | ICD-10-CM | POA: Diagnosis not present

## 2021-04-22 DIAGNOSIS — M65311 Trigger thumb, right thumb: Secondary | ICD-10-CM | POA: Diagnosis not present

## 2021-04-28 DIAGNOSIS — H02831 Dermatochalasis of right upper eyelid: Secondary | ICD-10-CM | POA: Diagnosis not present

## 2021-04-28 DIAGNOSIS — H18413 Arcus senilis, bilateral: Secondary | ICD-10-CM | POA: Diagnosis not present

## 2021-04-28 DIAGNOSIS — H26492 Other secondary cataract, left eye: Secondary | ICD-10-CM | POA: Diagnosis not present

## 2021-04-28 DIAGNOSIS — Z961 Presence of intraocular lens: Secondary | ICD-10-CM | POA: Diagnosis not present

## 2021-05-02 DIAGNOSIS — E291 Testicular hypofunction: Secondary | ICD-10-CM | POA: Diagnosis not present

## 2021-05-10 DIAGNOSIS — M65341 Trigger finger, right ring finger: Secondary | ICD-10-CM | POA: Diagnosis not present

## 2021-05-16 DIAGNOSIS — E291 Testicular hypofunction: Secondary | ICD-10-CM | POA: Diagnosis not present

## 2021-05-30 DIAGNOSIS — E291 Testicular hypofunction: Secondary | ICD-10-CM | POA: Diagnosis not present

## 2021-06-13 DIAGNOSIS — H10812 Pingueculitis, left eye: Secondary | ICD-10-CM | POA: Diagnosis not present

## 2021-06-13 DIAGNOSIS — E291 Testicular hypofunction: Secondary | ICD-10-CM | POA: Diagnosis not present

## 2021-06-27 DIAGNOSIS — M65332 Trigger finger, left middle finger: Secondary | ICD-10-CM | POA: Diagnosis not present

## 2021-06-27 DIAGNOSIS — E291 Testicular hypofunction: Secondary | ICD-10-CM | POA: Diagnosis not present

## 2021-06-27 DIAGNOSIS — G5602 Carpal tunnel syndrome, left upper limb: Secondary | ICD-10-CM | POA: Diagnosis not present

## 2021-07-11 DIAGNOSIS — E291 Testicular hypofunction: Secondary | ICD-10-CM | POA: Diagnosis not present

## 2021-07-15 DIAGNOSIS — D751 Secondary polycythemia: Secondary | ICD-10-CM | POA: Diagnosis not present

## 2021-07-15 DIAGNOSIS — Z125 Encounter for screening for malignant neoplasm of prostate: Secondary | ICD-10-CM | POA: Diagnosis not present

## 2021-07-15 DIAGNOSIS — I10 Essential (primary) hypertension: Secondary | ICD-10-CM | POA: Diagnosis not present

## 2021-07-15 DIAGNOSIS — E7800 Pure hypercholesterolemia, unspecified: Secondary | ICD-10-CM | POA: Diagnosis not present

## 2021-07-15 DIAGNOSIS — M109 Gout, unspecified: Secondary | ICD-10-CM | POA: Diagnosis not present

## 2021-07-15 DIAGNOSIS — E291 Testicular hypofunction: Secondary | ICD-10-CM | POA: Diagnosis not present

## 2021-07-22 DIAGNOSIS — Z125 Encounter for screening for malignant neoplasm of prostate: Secondary | ICD-10-CM | POA: Diagnosis not present

## 2021-07-22 DIAGNOSIS — Z0000 Encounter for general adult medical examination without abnormal findings: Secondary | ICD-10-CM | POA: Diagnosis not present

## 2021-07-22 DIAGNOSIS — E291 Testicular hypofunction: Secondary | ICD-10-CM | POA: Diagnosis not present

## 2021-07-22 DIAGNOSIS — M109 Gout, unspecified: Secondary | ICD-10-CM | POA: Diagnosis not present

## 2021-07-22 DIAGNOSIS — E7800 Pure hypercholesterolemia, unspecified: Secondary | ICD-10-CM | POA: Diagnosis not present

## 2021-07-22 DIAGNOSIS — R972 Elevated prostate specific antigen [PSA]: Secondary | ICD-10-CM | POA: Diagnosis not present

## 2021-07-22 DIAGNOSIS — Z79899 Other long term (current) drug therapy: Secondary | ICD-10-CM | POA: Diagnosis not present

## 2021-07-22 DIAGNOSIS — I10 Essential (primary) hypertension: Secondary | ICD-10-CM | POA: Diagnosis not present

## 2021-07-26 DIAGNOSIS — E291 Testicular hypofunction: Secondary | ICD-10-CM | POA: Diagnosis not present

## 2021-08-10 DIAGNOSIS — E291 Testicular hypofunction: Secondary | ICD-10-CM | POA: Diagnosis not present

## 2021-08-22 DIAGNOSIS — R972 Elevated prostate specific antigen [PSA]: Secondary | ICD-10-CM | POA: Diagnosis not present

## 2021-08-22 DIAGNOSIS — E291 Testicular hypofunction: Secondary | ICD-10-CM | POA: Diagnosis not present

## 2021-09-05 DIAGNOSIS — E291 Testicular hypofunction: Secondary | ICD-10-CM | POA: Diagnosis not present

## 2021-09-14 DIAGNOSIS — M47816 Spondylosis without myelopathy or radiculopathy, lumbar region: Secondary | ICD-10-CM | POA: Diagnosis not present

## 2021-09-19 DIAGNOSIS — G5602 Carpal tunnel syndrome, left upper limb: Secondary | ICD-10-CM | POA: Diagnosis not present

## 2021-09-19 DIAGNOSIS — M65332 Trigger finger, left middle finger: Secondary | ICD-10-CM | POA: Diagnosis not present

## 2021-09-20 DIAGNOSIS — E291 Testicular hypofunction: Secondary | ICD-10-CM | POA: Diagnosis not present

## 2021-09-21 DIAGNOSIS — M4696 Unspecified inflammatory spondylopathy, lumbar region: Secondary | ICD-10-CM | POA: Diagnosis not present

## 2021-09-26 DIAGNOSIS — M4696 Unspecified inflammatory spondylopathy, lumbar region: Secondary | ICD-10-CM | POA: Diagnosis not present

## 2021-09-28 DIAGNOSIS — M533 Sacrococcygeal disorders, not elsewhere classified: Secondary | ICD-10-CM | POA: Diagnosis not present

## 2021-10-03 DIAGNOSIS — S336XXD Sprain of sacroiliac joint, subsequent encounter: Secondary | ICD-10-CM | POA: Diagnosis not present

## 2021-10-03 DIAGNOSIS — E291 Testicular hypofunction: Secondary | ICD-10-CM | POA: Diagnosis not present

## 2021-10-03 DIAGNOSIS — M545 Low back pain, unspecified: Secondary | ICD-10-CM | POA: Diagnosis not present

## 2021-10-04 DIAGNOSIS — H31092 Other chorioretinal scars, left eye: Secondary | ICD-10-CM | POA: Diagnosis not present

## 2021-10-04 DIAGNOSIS — H43392 Other vitreous opacities, left eye: Secondary | ICD-10-CM | POA: Diagnosis not present

## 2021-10-04 DIAGNOSIS — H43812 Vitreous degeneration, left eye: Secondary | ICD-10-CM | POA: Diagnosis not present

## 2021-10-04 DIAGNOSIS — H59811 Chorioretinal scars after surgery for detachment, right eye: Secondary | ICD-10-CM | POA: Diagnosis not present

## 2021-10-05 DIAGNOSIS — M545 Low back pain, unspecified: Secondary | ICD-10-CM | POA: Diagnosis not present

## 2021-10-05 DIAGNOSIS — S336XXD Sprain of sacroiliac joint, subsequent encounter: Secondary | ICD-10-CM | POA: Diagnosis not present

## 2021-10-14 DIAGNOSIS — M545 Low back pain, unspecified: Secondary | ICD-10-CM | POA: Diagnosis not present

## 2021-10-14 DIAGNOSIS — S336XXD Sprain of sacroiliac joint, subsequent encounter: Secondary | ICD-10-CM | POA: Diagnosis not present

## 2021-10-18 ENCOUNTER — Other Ambulatory Visit: Payer: Self-pay | Admitting: Physical Medicine and Rehabilitation

## 2021-10-18 DIAGNOSIS — M545 Low back pain, unspecified: Secondary | ICD-10-CM | POA: Diagnosis not present

## 2021-10-18 DIAGNOSIS — M47816 Spondylosis without myelopathy or radiculopathy, lumbar region: Secondary | ICD-10-CM

## 2021-10-18 DIAGNOSIS — E291 Testicular hypofunction: Secondary | ICD-10-CM | POA: Diagnosis not present

## 2021-10-18 DIAGNOSIS — S336XXD Sprain of sacroiliac joint, subsequent encounter: Secondary | ICD-10-CM | POA: Diagnosis not present

## 2021-10-24 DIAGNOSIS — S336XXD Sprain of sacroiliac joint, subsequent encounter: Secondary | ICD-10-CM | POA: Diagnosis not present

## 2021-10-24 DIAGNOSIS — M545 Low back pain, unspecified: Secondary | ICD-10-CM | POA: Diagnosis not present

## 2021-10-25 ENCOUNTER — Ambulatory Visit
Admission: RE | Admit: 2021-10-25 | Discharge: 2021-10-25 | Disposition: A | Discharge status: Home / Self Care | Payer: PPO | Source: Ambulatory Visit | Attending: Physical Medicine and Rehabilitation | Admitting: Physical Medicine and Rehabilitation

## 2021-10-25 DIAGNOSIS — M4807 Spinal stenosis, lumbosacral region: Secondary | ICD-10-CM | POA: Diagnosis not present

## 2021-10-25 DIAGNOSIS — R202 Paresthesia of skin: Secondary | ICD-10-CM | POA: Diagnosis not present

## 2021-10-25 DIAGNOSIS — M48061 Spinal stenosis, lumbar region without neurogenic claudication: Secondary | ICD-10-CM | POA: Diagnosis not present

## 2021-10-25 DIAGNOSIS — M47816 Spondylosis without myelopathy or radiculopathy, lumbar region: Secondary | ICD-10-CM

## 2021-10-25 DIAGNOSIS — M545 Low back pain, unspecified: Secondary | ICD-10-CM | POA: Diagnosis not present

## 2021-10-31 DIAGNOSIS — E291 Testicular hypofunction: Secondary | ICD-10-CM | POA: Diagnosis not present

## 2021-10-31 DIAGNOSIS — Z79899 Other long term (current) drug therapy: Secondary | ICD-10-CM | POA: Diagnosis not present

## 2021-11-01 DIAGNOSIS — S336XXD Sprain of sacroiliac joint, subsequent encounter: Secondary | ICD-10-CM | POA: Diagnosis not present

## 2021-11-01 DIAGNOSIS — M5416 Radiculopathy, lumbar region: Secondary | ICD-10-CM | POA: Diagnosis not present

## 2021-11-01 DIAGNOSIS — M545 Low back pain, unspecified: Secondary | ICD-10-CM | POA: Diagnosis not present

## 2021-11-03 DIAGNOSIS — M545 Low back pain, unspecified: Secondary | ICD-10-CM | POA: Diagnosis not present

## 2021-11-03 DIAGNOSIS — S336XXD Sprain of sacroiliac joint, subsequent encounter: Secondary | ICD-10-CM | POA: Diagnosis not present

## 2021-11-14 DIAGNOSIS — E291 Testicular hypofunction: Secondary | ICD-10-CM | POA: Diagnosis not present

## 2021-11-15 DIAGNOSIS — M47816 Spondylosis without myelopathy or radiculopathy, lumbar region: Secondary | ICD-10-CM | POA: Diagnosis not present

## 2021-11-22 ENCOUNTER — Ambulatory Visit: Payer: Self-pay | Admitting: Internal Medicine

## 2021-11-24 ENCOUNTER — Telehealth: Payer: Self-pay | Admitting: Hematology

## 2021-11-24 NOTE — Telephone Encounter (Signed)
Scheduled appt per 10/17 referral. Pt is aware of appt date and time. Pt is aware to arrive 15 mins prior to appt time and to bring and updated insurance card. Pt is aware of appt location.   

## 2021-12-05 DIAGNOSIS — M47816 Spondylosis without myelopathy or radiculopathy, lumbar region: Secondary | ICD-10-CM | POA: Diagnosis not present

## 2021-12-05 DIAGNOSIS — E291 Testicular hypofunction: Secondary | ICD-10-CM | POA: Diagnosis not present

## 2021-12-13 ENCOUNTER — Other Ambulatory Visit: Payer: Self-pay

## 2021-12-13 ENCOUNTER — Inpatient Hospital Stay: Payer: PPO

## 2021-12-13 ENCOUNTER — Inpatient Hospital Stay: Payer: PPO | Attending: Hematology | Admitting: Hematology

## 2021-12-13 VITALS — BP 135/80 | HR 62 | Temp 98.0°F | Resp 15 | Ht 74.0 in | Wt 214.7 lb

## 2021-12-13 DIAGNOSIS — D751 Secondary polycythemia: Secondary | ICD-10-CM

## 2021-12-13 DIAGNOSIS — I10 Essential (primary) hypertension: Secondary | ICD-10-CM | POA: Insufficient documentation

## 2021-12-13 DIAGNOSIS — D72821 Monocytosis (symptomatic): Secondary | ICD-10-CM | POA: Insufficient documentation

## 2021-12-13 LAB — CBC WITH DIFFERENTIAL (CANCER CENTER ONLY)
Abs Immature Granulocytes: 0.02 10*3/uL (ref 0.00–0.07)
Basophils Absolute: 0.1 10*3/uL (ref 0.0–0.1)
Basophils Relative: 1 %
Eosinophils Absolute: 0.2 10*3/uL (ref 0.0–0.5)
Eosinophils Relative: 3 %
HCT: 50.3 % (ref 39.0–52.0)
Hemoglobin: 17.2 g/dL — ABNORMAL HIGH (ref 13.0–17.0)
Immature Granulocytes: 0 %
Lymphocytes Relative: 24 %
Lymphs Abs: 1.8 10*3/uL (ref 0.7–4.0)
MCH: 31.4 pg (ref 26.0–34.0)
MCHC: 34.2 g/dL (ref 30.0–36.0)
MCV: 91.8 fL (ref 80.0–100.0)
Monocytes Absolute: 1.1 10*3/uL — ABNORMAL HIGH (ref 0.1–1.0)
Monocytes Relative: 14 %
Neutro Abs: 4.3 10*3/uL (ref 1.7–7.7)
Neutrophils Relative %: 58 %
Platelet Count: 205 10*3/uL (ref 150–400)
RBC: 5.48 MIL/uL (ref 4.22–5.81)
RDW: 13.7 % (ref 11.5–15.5)
WBC Count: 7.4 10*3/uL (ref 4.0–10.5)
nRBC: 0 % (ref 0.0–0.2)

## 2021-12-13 LAB — CMP (CANCER CENTER ONLY)
ALT: 19 U/L (ref 0–44)
AST: 24 U/L (ref 15–41)
Albumin: 4.1 g/dL (ref 3.5–5.0)
Alkaline Phosphatase: 59 U/L (ref 38–126)
Anion gap: 5 (ref 5–15)
BUN: 21 mg/dL (ref 8–23)
CO2: 29 mmol/L (ref 22–32)
Calcium: 9.3 mg/dL (ref 8.9–10.3)
Chloride: 103 mmol/L (ref 98–111)
Creatinine: 1.15 mg/dL (ref 0.61–1.24)
GFR, Estimated: >60 mL/min (ref >60)
Glucose, Bld: 79 mg/dL (ref 70–99)
Potassium: 4.1 mmol/L (ref 3.5–5.1)
Sodium: 137 mmol/L (ref 135–145)
Total Bilirubin: 0.6 mg/dL (ref 0.3–1.2)
Total Protein: 6.8 g/dL (ref 6.5–8.1)

## 2021-12-13 LAB — LACTATE DEHYDROGENASE: LDH: 142 U/L (ref 98–192)

## 2021-12-13 NOTE — Progress Notes (Signed)
Marland Kitchen   HEMATOLOGY/ONCOLOGY CONSULTATION NOTE  Date of Service: 12/13/2021  Patient Care Team: Lawerance Cruel, MD as PCP - General (Family Medicine) Josue Hector, MD as PCP - Cardiology (Cardiology)  CHIEF COMPLAINTS/PURPOSE OF CONSULTATION:  monocytosis  HISTORY OF PRESENTING ILLNESS:   Willliam Anderson. is a wonderful 76 y.o. male who has been referred to [place] by Dr .Harrington Challenger, Dwyane Luo, MD for evaluation and management of chronic monocytosis and polycythemia.  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  His monocyte counts have been elevated to the 1200 range since at least 2019. He has also had intermittent polycythemia related to his testosterone replacement and has been donating blood intermittently to control his hematocrit.  Patient notes no acute new symptoms recently.  No change in his clinical status over the last 6 to 12 months.  Good p.o. intake.  Stable weight.  No new bone pains.  No abnormal bleeding or bruising.  No new infection issues.  No new lumps or bumps.  No new abdominal pain or distention.  No other new focal symptoms suggestive of malignancy.    MEDICAL HISTORY:  Past Medical History:  Diagnosis Date   Adrenal insufficiency ([place])    Cataract    Dysplastic nevus    Jarome Anderson   HTN (hypertension)    Benign   Hypercholesterolemia    Low testosterone    Lower back pain    PVC's (premature ventricular contractions)    STRESS TEST IN [facility]   Subdural hematoma 2019    SURGICAL HISTORY: Past Surgical History:  Procedure Laterality Date   CATARACT EXTRACTION, BILATERAL  2013   Bil   RETINAL DETACHMENT SURGERY     RIGHT EYE   SHOULDER SURGERY  2008   Left, right    SOCIAL HISTORY: Social History   Socioeconomic History   Marital status: Married    Spouse name: Not on file   Number of children: Not on file   Years of  education: Not on file   Highest education level: Not on file  Occupational History   Not on file  Tobacco Use   Smoking status: Never   Smokeless tobacco: Never  Substance and Sexual Activity   Alcohol use: Yes    Alcohol/week: 7.0 standard drinks of alcohol    Types: 7 Standard drinks or equivalent per week   Drug use: No   Sexual activity: Not on file  Other Topics Concern   Not on file  Social History Narrative   Not on file   Social Determinants of Health   Financial Resource Strain: Not on file  Food Insecurity: Not on file  Transportation Needs: Not on file  Physical Activity: Not on file  Stress: Not on file  Social Connections: Not on file  Intimate Partner Violence: Not on file    FAMILY HISTORY: Family History  Problem Relation Age of Onset   Atrial fibrillation Mother    Ovarian cancer Mother    Heart disease Sister    Dementia Neg Hx     ALLERGIES:  is allergic to amlodipine besylate, ciprofloxacin, clomiphene, irbesartan, oseltamivir, other, and penicillins.  MEDICATIONS:  Current Outpatient Medications  Medication Sig Dispense Refill   allopurinol (ZYLOPRIM) 100 MG tablet 1 tablet     cholecalciferol (VITAMIN D) 1000 UNITS tablet Take 1,000 Units by mouth daily.     furosemide (LASIX) 20 MG  tablet Take 20 mg by mouth daily.     gabapentin (NEURONTIN) 300 MG capsule Take 300 mg by mouth daily.     HYDROcodone-acetaminophen (NORCO/VICODIN) 5-325 MG tablet Take 1-2 tablets by mouth every 4 (four) hours as needed for moderate pain. 30 tablet 0   L-LYSINE PO Take 1 tablet by mouth daily.      metoprolol tartrate (LOPRESSOR) 25 MG tablet TAKE 1 TABLET BY MOUTH TWICE A DAY 180 tablet 3   milk thistle 175 MG tablet Take 175 mg by mouth daily.     Misc Natural Products (GLUCOSAMINE CHOND CMP ADVANCED PO) Take by mouth.     Omega-3 Fatty Acids (FISH OIL) 1000 MG CAPS Take 1 capsule by mouth daily.      testosterone cypionate (DEPOTESTOSTERONE CYPIONATE) 200  MG/ML injection Inject 200 mg into the muscle every 14 (fourteen) days.     TURMERIC PO Take 1 tablet by mouth daily. '1000mg'$  daily     valsartan (DIOVAN) 320 MG tablet Take 320 mg by mouth daily.     vitamin E 100 UNIT capsule Take 100 Units by mouth daily.      No current facility-administered medications for this visit.    REVIEW OF SYSTEMS:    10 Point review of Systems was done is negative except as noted above.  PHYSICAL EXAMINATION: ECOG PERFORMANCE STATUS: 1 - Symptomatic but completely ambulatory  . Vitals:   12/13/21 1438  BP: 135/80  Pulse: 62  Resp: 15  Temp: 98 F (36.7 C)  SpO2: 97%   Filed Weights   12/13/21 1438  Weight: 214 lb 11.2 oz (97.4 kg)   .Body mass index is 27.57 kg/m.  GENERAL:alert, in no acute distress and comfortable SKIN: no acute rashes, no significant lesions EYES: conjunctiva are pink and non-injected, sclera anicteric OROPHARYNX: MMM, no exudates, no oropharyngeal erythema or ulceration NECK: supple, no JVD LYMPH:  no palpable lymphadenopathy in the cervical, axillary or inguinal regions LUNGS: clear to auscultation b/l with normal respiratory effort HEART: regular rate & rhythm ABDOMEN:  normoactive bowel sounds , non tender, not distended.  No palpable hepatosplenomegaly. Extremity: no pedal edema PSYCH: alert & oriented x 3 with fluent speech NEURO: no focal motor/sensory deficits  LABORATORY DATA:  I have reviewed the data as listed  .    Latest Ref Rng & Units 12/13/2021    3:15 PM 10/07/2020    2:57 AM 10/06/2020   12:28 PM  CBC  WBC 4.0 - 10.5 K/uL 7.4  10.1  10.8   Hemoglobin 13.0 - 17.0 g/dL 17.2  17.3  18.9   Hematocrit 39.0 - 52.0 % 50.3  51.2  57.2   Platelets 150 - 400 K/uL 205  200  198    .CBC    Component Value Date/Time   WBC 7.4 12/13/2021 1515   WBC 10.1 10/07/2020 0257   RBC 5.48 12/13/2021 1515   HGB 17.2 (H) 12/13/2021 1515   HGB 15.8 03/23/2015 1528   HCT 50.3 12/13/2021 1515   HCT 46.5  03/23/2015 1528   PLT 205 12/13/2021 1515   PLT 207 03/23/2015 1528   MCV 91.8 12/13/2021 1515   MCV 92.3 03/23/2015 1528   MCH 31.4 12/13/2021 1515   MCHC 34.2 12/13/2021 1515   RDW 13.7 12/13/2021 1515   RDW 12.9 03/23/2015 1528   LYMPHSABS 1.8 12/13/2021 1515   LYMPHSABS 1.5 03/23/2015 1528   MONOABS 1.1 (H) 12/13/2021 1515   MONOABS 0.8 03/23/2015 1528   EOSABS 0.2  12/13/2021 1515   EOSABS 0.2 03/23/2015 1528   BASOSABS 0.1 12/13/2021 1515   BASOSABS 0.0 03/23/2015 1528        Latest Ref Rng & Units 10/15/2020    9:02 AM 10/07/2020    2:57 AM 10/06/2020   12:28 PM  CMP  Glucose 65 - 99 mg/dL 84  96  89   BUN 8 - 27 mg/dL '14  15  14   '$ Creatinine 0.76 - 1.27 mg/dL 1.13  1.07  1.13   Sodium 134 - 144 mmol/L 136  136  134   Potassium 3.5 - 5.2 mmol/L 4.2  3.9  4.3   Chloride 96 - 106 mmol/L 96  102  98   CO2 20 - 29 mmol/L '25  22  24   '$ Calcium 8.6 - 10.2 mg/dL 9.4  8.6  9.2   Total Protein 6.5 - 8.1 g/dL   7.4   Total Bilirubin 0.3 - 1.2 mg/dL   1.8   Alkaline Phos 38 - 126 U/L   63   AST 15 - 41 U/L   28   ALT 0 - 44 U/L   19      RADIOGRAPHIC STUDIES: I have personally reviewed the radiological images as listed and agreed with the findings in the report. No results found.  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 No lymphadenopathy or hepatosplenomegaly. No symptoms suggestive of malignancy. No unusual infections or infectious symptoms. Patient is a non-smoker.  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  Plan -Patient's chart and labs were reviewed in detail and confirmed with the patient. -We discussed that his polycythemia and chronic nonprogressive monocytosis appear to be likely related to his testosterone replacement and are much less likely to be a  clonal bone marrow process/MPN. -Patient will get a JAK2, Calreticulin and MPL mutation testing to rule out a clonal process. 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  He will also talk to his prescribing physician about using the lowest effective dose. -Based on his lab work-up from today will determine if there is any indication for bone marrow examination but overall patient has been stable.  Orders Placed This Encounter  Procedures   CBC with Differential ([place] Only)    Standing Status:   Future    Number of Occurrences:   1    Standing Expiration Date:   12/14/2022   CMP ([place] only)    Standing Status:   Future    Number of Occurrences:   1    Standing Expiration Date:   12/14/2022   JAK2 (including [id number] and Exon 12), MPL, and CALR-Next Generation Sequencing    Standing Status:   Future    Number of Occurrences:   1    Standing Expiration Date:   12/14/2022   Lactate dehydrogenase    Standing Status:   Future    Number of Occurrences:   1    Standing Expiration Date:   12/14/2022    The total time spent in the appointment was 60 minutes*.  All of the patient's questions were answered with apparent satisfaction. The patient knows to call the clinic with any problems, questions or concerns.   Sullivan Lone MD MS AAHIVMS [facility] Hematology/Oncology Physician [facility]  .*  Total Encounter Time as defined by the Centers for Medicare and Medicaid Services includes, in addition to the face-to-face time of a patient visit (documented in the note above) non-face-to-face time: obtaining and reviewing outside history, ordering and reviewing medications, tests or procedures, care coordination (communications with other health care professionals or caregivers) and documentation in the medical record.   12/13/2021 2:34 PM

## 2021-12-19 LAB — JAK2 (INCLUDING V617F AND EXON 12), MPL,& CALR-NEXT GEN SEQ

## 2021-12-19 NOTE — Progress Notes (Incomplete)
Marland Kitchen   HEMATOLOGY/ONCOLOGY CONSULTATION NOTE  Date of Service: 12/13/2021  Patient Care Team: Alexander Cruel, MD as PCP - General (Family Medicine) Alexander Hector, MD as PCP - Cardiology (Cardiology)  CHIEF COMPLAINTS/PURPOSE OF CONSULTATION:  monocytosis  HISTORY OF PRESENTING ILLNESS:  Alexander Anderson. is a wonderful 76 y.o. male who has been referred to [place] by Dr Alexander Anderson for evaluation and management of   MEDICAL HISTORY:  Past Medical History:  Diagnosis Date  . Adrenal insufficiency ([place])   . Cataract   . Dysplastic nevus    Alexander Anderson  . HTN (hypertension)    Benign  . Hypercholesterolemia   . Low testosterone   . Lower back pain   . PVC's (premature ventricular contractions)    STRESS TEST IN [facility]  . Subdural hematoma 2019    SURGICAL HISTORY: Past Surgical History:  Procedure Laterality Date  . CATARACT EXTRACTION, BILATERAL  2013   Bil  . RETINAL DETACHMENT SURGERY     RIGHT EYE  . SHOULDER SURGERY  2008   Left, right    SOCIAL HISTORY: Social History   Socioeconomic History  . Marital status: Married    Spouse name: Not on file  . Number of children: Not on file  . Years of education: Not on file  . Highest education level: Not on file  Occupational History  . Not on file  Tobacco Use  . Smoking status: Never  . Smokeless tobacco: Never  Substance and Sexual Activity  . Alcohol use: Yes    Alcohol/week: 7.0 standard drinks of alcohol    Types: 7 Standard drinks or equivalent per week  . Drug use: No  . Sexual activity: Not on file  Other Topics Concern  . Not on file  Social History Narrative  . Not on file   Social Determinants of Health   Financial Resource Strain: Not on file  Food Insecurity: Not on file  Transportation Needs: Not on file  Physical Activity: Not on file  Stress: Not on file  Social Connections: Not on file  Intimate Partner Violence: Not on file    FAMILY HISTORY: Family History  Problem Relation Age of  Onset  . Atrial fibrillation Mother   . Ovarian cancer Mother   . Heart disease Sister   . Dementia Neg Hx     ALLERGIES:  is allergic to amlodipine besylate, ciprofloxacin, clomiphene, irbesartan, oseltamivir, other, and penicillins.  MEDICATIONS:  Current Outpatient Medications  Medication Sig Dispense Refill  . allopurinol (ZYLOPRIM) 100 MG tablet 1 tablet    . cholecalciferol (VITAMIN D) 1000 UNITS tablet Take 1,000 Units by mouth daily.    . furosemide (LASIX) 20 MG tablet Take 20 mg by mouth daily.    Marland Kitchen gabapentin (NEURONTIN) 300 MG capsule Take 300 mg by mouth daily.    Marland Kitchen HYDROcodone-acetaminophen (NORCO/VICODIN) 5-325 MG tablet Take 1-2 tablets by mouth every 4 (four) hours as needed for moderate pain. 30 tablet 0  . L-LYSINE PO Take 1 tablet by mouth daily.     . metoprolol tartrate (LOPRESSOR) 25 MG tablet TAKE 1 TABLET BY MOUTH TWICE A DAY 180 tablet 3  . milk thistle 175 MG tablet Take 175 mg by mouth daily.    . Misc Natural Products (GLUCOSAMINE CHOND CMP ADVANCED PO) Take by mouth.    . Omega-3 Fatty Acids (FISH OIL) 1000 MG CAPS Take 1 capsule by mouth daily.     Marland Kitchen testosterone cypionate (DEPOTESTOSTERONE CYPIONATE) 200 MG/ML  injection Inject 200 mg into the muscle every 14 (fourteen) days.    . TURMERIC PO Take 1 tablet by mouth daily. '1000mg'$  daily    . valsartan (DIOVAN) 320 MG tablet Take 320 mg by mouth daily.    . vitamin E 100 UNIT capsule Take 100 Units by mouth daily.      No current facility-administered medications for this visit.    REVIEW OF SYSTEMS:    10 Point review of Systems was done is negative except as noted above.  PHYSICAL EXAMINATION: ECOG PERFORMANCE STATUS: {CHL ONC ECOG CB:[id number]}  .There were no vitals filed for this visit. There were no vitals filed for this visit. .There is no height or weight on file to calculate BMI.  GENERAL:alert, in no acute distress and comfortable SKIN: no acute rashes, no significant lesions EYES:  conjunctiva are pink and non-injected, sclera anicteric OROPHARYNX: MMM, no exudates, no oropharyngeal erythema or ulceration NECK: supple, no JVD LYMPH:  no palpable lymphadenopathy in the cervical, axillary or inguinal regions LUNGS: clear to auscultation b/l with normal respiratory effort HEART: regular rate & rhythm ABDOMEN:  normoactive bowel sounds , non tender, not distended. Extremity: no pedal edema PSYCH: alert & oriented x 3 with fluent speech NEURO: no focal motor/sensory deficits  LABORATORY DATA:  I have reviewed the data as listed  .    Latest Ref Rng & Units 10/07/2020    2:57 AM 10/06/2020   12:28 PM 03/23/2015    3:28 PM  CBC  WBC 4.0 - 10.5 K/uL 10.1  10.8  7.1   Hemoglobin 13.0 - 17.0 g/dL 17.3  18.9  15.8   Hematocrit 39.0 - 52.0 % 51.2  57.2  46.5   Platelets 150 - 400 K/uL 200  198  207     .    Latest Ref Rng & Units 10/15/2020    9:02 AM 10/07/2020    2:57 AM 10/06/2020   12:28 PM  CMP  Glucose 65 - 99 mg/dL 84  96  89   BUN 8 - 27 mg/dL '14  15  14   '$ Creatinine 0.76 - 1.27 mg/dL 1.13  1.07  1.13   Sodium 134 - 144 mmol/L 136  136  134   Potassium 3.5 - 5.2 mmol/L 4.2  3.9  4.3   Chloride 96 - 106 mmol/L 96  102  98   CO2 20 - 29 mmol/L '25  22  24   '$ Calcium 8.6 - 10.2 mg/dL 9.4  8.6  9.2   Total Protein 6.5 - 8.1 g/dL   7.4   Total Bilirubin 0.3 - 1.2 mg/dL   1.8   Alkaline Phos 38 - 126 U/L   63   AST 15 - 41 U/L   28   ALT 0 - 44 U/L   19      RADIOGRAPHIC STUDIES: I have personally reviewed the radiological images as listed and agreed with the findings in the report. No results found.  ASSESSMENT & PLAN:  ***  All of the patients questions were answered with apparent satisfaction. The patient knows to call the clinic with any problems, questions or concerns.  I spent {CHL ONC TIME VISIT - HYWVP:[id number]} counseling the patient face to face. The total time spent in the appointment was {CHL ONC TIME VISIT - IOEVO:[id number]} and more than  50% was on counseling and direct patient cares.    Alexander Lone MD MS AAHIVMS [facility] Hematology/Oncology Physician [facility]  (  Office):       [phone] (Work cell):  [phone] (Fax):           [phone]  12/13/2021 2:34 PM

## 2021-12-20 ENCOUNTER — Telehealth: Payer: Self-pay | Admitting: Hematology

## 2021-12-20 DIAGNOSIS — E291 Testicular hypofunction: Secondary | ICD-10-CM | POA: Diagnosis not present

## 2021-12-20 NOTE — Telephone Encounter (Signed)
Called patient per 11/14 in basket. Patient notified of new upcoming appointment.

## 2021-12-21 DIAGNOSIS — G5602 Carpal tunnel syndrome, left upper limb: Secondary | ICD-10-CM | POA: Diagnosis not present

## 2021-12-21 DIAGNOSIS — G5603 Carpal tunnel syndrome, bilateral upper limbs: Secondary | ICD-10-CM | POA: Diagnosis not present

## 2021-12-28 ENCOUNTER — Telehealth: Payer: PPO | Admitting: Hematology

## 2022-01-02 DIAGNOSIS — E291 Testicular hypofunction: Secondary | ICD-10-CM | POA: Diagnosis not present

## 2022-01-05 DIAGNOSIS — E7800 Pure hypercholesterolemia, unspecified: Secondary | ICD-10-CM | POA: Diagnosis not present

## 2022-01-05 DIAGNOSIS — I10 Essential (primary) hypertension: Secondary | ICD-10-CM | POA: Diagnosis not present

## 2022-01-06 ENCOUNTER — Inpatient Hospital Stay: Payer: PPO | Attending: Hematology | Admitting: Hematology

## 2022-01-06 NOTE — Progress Notes (Incomplete)
Marland Kitchen   HEMATOLOGY/ONCOLOGY CONSULTATION NOTE  Date of Service: 01/06/2022  Patient Care Team: Lawerance Cruel, MD as PCP - General (Family Medicine) Josue Hector, MD as PCP - Cardiology (Cardiology) Brunetta Genera, MD as Consulting Physician (Hematology)  CHIEF COMPLAINTS/PURPOSE OF CONSULTATION:  monocytosis  HISTORY OF PRESENTING ILLNESS:   Alexander Anderson. is a wonderful 76 y.o. male who has been referred to [place] by Dr .Harrington Challenger, Dwyane Luo, MD for evaluation and management of chronic monocytosis and polycythemia.  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  His monocyte counts have been elevated to the 1200 range since at least 2019. He has also had intermittent polycythemia related to his testosterone replacement and has been donating blood intermittently to control his hematocrit.  Patient notes no acute new symptoms recently.  No change in his clinical status over the last 6 to 12 months.  Good p.o. intake.  Stable weight.  No new bone pains.  No abnormal bleeding or bruising.  No new infection issues.  No new lumps or bumps.  No new abdominal pain or distention.  No other new focal symptoms suggestive of malignancy.    MEDICAL HISTORY:  Past Medical History:  Diagnosis Date   Adrenal insufficiency ([place])    Cataract    Dysplastic nevus    [name]   HTN (hypertension)    Benign   Hypercholesterolemia    Low testosterone    Lower back pain    PVC's (premature ventricular contractions)    STRESS TEST IN [facility]   Subdural hematoma 2019    SURGICAL HISTORY: Past Surgical History:  Procedure Laterality Date   CATARACT EXTRACTION, BILATERAL  2013   Bil   RETINAL DETACHMENT SURGERY     RIGHT EYE   SHOULDER SURGERY  2008   Left, right    SOCIAL HISTORY: Social History   Socioeconomic History   Marital status: Married    Spouse  name: Not on file   Number of children: Not on file   Years of education: Not on file   Highest education level: Not on file  Occupational History   Not on file  Tobacco Use   Smoking status: Never   Smokeless tobacco: Never  Substance and Sexual Activity   Alcohol use: Yes    Alcohol/week: 7.0 standard drinks of alcohol    Types: 7 Standard drinks or equivalent per week   Drug use: No   Sexual activity: Not on file  Other Topics Concern   Not on file  Social History Narrative   Not on file   Social Determinants of Health   Financial Resource Strain: Not on file  Food Insecurity: Not on file  Transportation Needs: Not on file  Physical Activity: Not on file  Stress: Not on file  Social Connections: Not on file  Intimate Partner Violence: Not on file    FAMILY HISTORY: Family History  Problem Relation Age of Onset   Atrial fibrillation Mother    Ovarian cancer Mother    Heart disease Sister    Dementia Neg Hx     ALLERGIES:  is allergic to amlodipine besylate, ciprofloxacin, clomiphene, irbesartan, oseltamivir, other, and penicillins.  MEDICATIONS:  Current Outpatient Medications  Medication Sig Dispense Refill   allopurinol (ZYLOPRIM) 100 MG tablet 1 tablet     cholecalciferol (VITAMIN D) 1000 UNITS tablet Take 1,000 Units by mouth daily.  colchicine 0.6 MG tablet      furosemide (LASIX) 20 MG tablet Take 20 mg by mouth daily.     gabapentin (NEURONTIN) 300 MG capsule Take 300 mg by mouth daily.     HYDROcodone-acetaminophen (NORCO/VICODIN) 5-325 MG tablet Take 1-2 tablets by mouth every 4 (four) hours as needed for moderate pain. 30 tablet 0   L-LYSINE PO Take 1 tablet by mouth daily.      metoprolol tartrate (LOPRESSOR) 25 MG tablet TAKE 1 TABLET BY MOUTH TWICE A DAY 180 tablet 3   milk thistle 175 MG tablet Take 175 mg by mouth daily.     Misc Natural Products (GLUCOSAMINE CHOND CMP ADVANCED PO) Take by mouth.     Omega-3 Fatty Acids (FISH OIL) 1000 MG  CAPS Take 1 capsule by mouth daily.      testosterone cypionate (DEPOTESTOSTERONE CYPIONATE) 200 MG/ML injection Inject 200 mg into the muscle every 14 (fourteen) days.     TURMERIC PO Take 1 tablet by mouth daily. '1000mg'$  daily     valsartan (DIOVAN) 320 MG tablet Take 320 mg by mouth daily.     vitamin E 100 UNIT capsule Take 100 Units by mouth daily.  (Patient not taking: Reported on 12/13/2021)     No current facility-administered medications for this visit.    REVIEW OF SYSTEMS:    10 Point review of Systems was done is negative except as noted above.  PHYSICAL EXAMINATION: ECOG PERFORMANCE STATUS: 1 - Symptomatic but completely ambulatory  . There were no vitals filed for this visit.  There were no vitals filed for this visit.  .There is no height or weight on file to calculate BMI.  GENERAL:alert, in no acute distress and comfortable SKIN: no acute rashes, no significant lesions EYES: conjunctiva are pink and non-injected, sclera anicteric OROPHARYNX: MMM, no exudates, no oropharyngeal erythema or ulceration NECK: supple, no JVD LYMPH:  no palpable lymphadenopathy in the cervical, axillary or inguinal regions LUNGS: clear to auscultation b/l with normal respiratory effort HEART: regular rate & rhythm ABDOMEN:  normoactive bowel sounds , non tender, not distended.  No palpable hepatosplenomegaly. Extremity: no pedal edema PSYCH: alert & oriented x 3 with fluent speech NEURO: no focal motor/sensory deficits  LABORATORY DATA:  I have reviewed the data as listed  .    Latest Ref Rng & Units 12/13/2021    3:15 PM 10/07/2020    2:57 AM 10/06/2020   12:28 PM  CBC  WBC 4.0 - 10.5 K/uL 7.4  10.1  10.8   Hemoglobin 13.0 - 17.0 g/dL 17.2  17.3  18.9   Hematocrit 39.0 - 52.0 % 50.3  51.2  57.2   Platelets 150 - 400 K/uL 205  200  198    .CBC    Component Value Date/Time   WBC 7.4 12/13/2021 1515   WBC 10.1 10/07/2020 0257   RBC 5.48 12/13/2021 1515   HGB 17.2 (H)  12/13/2021 1515   HGB 15.8 03/23/2015 1528   HCT 50.3 12/13/2021 1515   HCT 46.5 03/23/2015 1528   PLT 205 12/13/2021 1515   PLT 207 03/23/2015 1528   MCV 91.8 12/13/2021 1515   MCV 92.3 03/23/2015 1528   MCH 31.4 12/13/2021 1515   MCHC 34.2 12/13/2021 1515   RDW 13.7 12/13/2021 1515   RDW 12.9 03/23/2015 1528   LYMPHSABS 1.8 12/13/2021 1515   LYMPHSABS 1.5 03/23/2015 1528   MONOABS 1.1 (H) 12/13/2021 1515   MONOABS 0.8 03/23/2015 1528   EOSABS  0.2 12/13/2021 1515   EOSABS 0.2 03/23/2015 1528   BASOSABS 0.1 12/13/2021 1515   BASOSABS 0.0 03/23/2015 1528        Latest Ref Rng & Units 12/13/2021    3:15 PM 10/15/2020    9:02 AM 10/07/2020    2:57 AM  CMP  Glucose 70 - 99 mg/dL 79  84  96   BUN 8 - 23 mg/dL '21  14  15   '$ Creatinine 0.61 - 1.24 mg/dL 1.15  1.13  1.07   Sodium 135 - 145 mmol/L 137  136  136   Potassium 3.5 - 5.1 mmol/L 4.1  4.2  3.9   Chloride 98 - 111 mmol/L 103  96  102   CO2 22 - 32 mmol/L '29  25  22   '$ Calcium 8.9 - 10.3 mg/dL 9.3  9.4  8.6   Total Protein 6.5 - 8.1 g/dL 6.8     Total Bilirubin 0.3 - 1.2 mg/dL 0.6     Alkaline Phos 38 - 126 U/L 59     AST 15 - 41 U/L 24     ALT 0 - 44 U/L 19        RADIOGRAPHIC STUDIES: I have personally reviewed the radiological images as listed and agreed with the findings in the report. No results found.  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 No lymphadenopathy or hepatosplenomegaly. No symptoms suggestive of malignancy. No unusual infections or infectious symptoms. Patient is a non-smoker.  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  Plan -Patient's chart and labs were reviewed in detail and confirmed with the patient. -We discussed that his polycythemia and chronic nonprogressive monocytosis appear to be  likely related to his testosterone replacement and are much less likely to be a clonal bone marrow process/MPN. -Patient will get a JAK2, Calreticulin and MPL mutation testing to rule out a clonal process. 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  He will also talk to his prescribing physician about using the lowest effective dose. -Based on his lab work-up from today will determine if there is any indication for bone marrow examination but overall patient has been stable.  Mild polycythemia hct 57-50 Moncytosis unchanged LDH normal- suggest overactive bone marrow Negative for mutation-reassuring  ***  No orders of the defined types were placed in this encounter.  The total time spent in the appointment was *** minutes* .  All of the patient's questions were answered with apparent satisfaction. The patient knows to call the clinic with any problems, questions or concerns.   I,Alexis Herring,acting as a Education administrator for Sullivan Lone, MD.,have documented all relevant documentation on the behalf of Sullivan Lone, MD,as directed by  Sullivan Lone, MD while in the presence of Sullivan Lone, MD.  ***  Sullivan Lone MD [place] AAHIVMS [facility] Hematology/Oncology Physician [facility]  .*Total Encounter Time as defined by the Centers for Medicare and Medicaid Services includes, in addition to the face-to-face time of a patient visit (documented in the note above) non-face-to-face time: obtaining and reviewing outside history, ordering and reviewing medications, tests or procedures, care coordination (communications with other health care professionals or caregivers) and documentation in the medical record.

## 2022-01-11 DIAGNOSIS — L738 Other specified follicular disorders: Secondary | ICD-10-CM | POA: Diagnosis not present

## 2022-01-11 DIAGNOSIS — L218 Other seborrheic dermatitis: Secondary | ICD-10-CM | POA: Diagnosis not present

## 2022-01-11 DIAGNOSIS — L821 Other seborrheic keratosis: Secondary | ICD-10-CM | POA: Diagnosis not present

## 2022-01-11 DIAGNOSIS — L812 Freckles: Secondary | ICD-10-CM | POA: Diagnosis not present

## 2022-01-11 DIAGNOSIS — D225 Melanocytic nevi of trunk: Secondary | ICD-10-CM | POA: Diagnosis not present

## 2022-01-13 NOTE — Progress Notes (Signed)
This encounter was created in error - please disregard.   Patient busy with a client and we were unable to do phone visit.

## 2022-01-16 DIAGNOSIS — E291 Testicular hypofunction: Secondary | ICD-10-CM | POA: Diagnosis not present

## 2022-01-18 ENCOUNTER — Telehealth: Payer: Self-pay

## 2022-01-18 NOTE — Telephone Encounter (Signed)
I s/w the pt about needing an appt for pre op clearance. Pt states that means that $40 out of his pocket for [name] to tell him he is fine. I explained to the pt that there is a preop protocol that is followed and that I was just doing what I am supposed to be doing. Pt states back yes and that he is doing what he is doing by fussing about it. I informed the pt that I am not promising anything but I will bring this to the attention of the pre op team and [name]. Pt said thank you.

## 2022-01-18 NOTE — Telephone Encounter (Signed)
..     Pre-operative Risk Assessment    Patient Name: Alexander Anderson.  DOB: 1945-03-18 MRN: [id number]      Request for Surgical Clearance    Procedure:   left carpal tunnel release and middle finger release  Date of Surgery:  Clearance 03/09/22                                 Surgeon:  dr [name] Surgeon's Group or Practice Name:  [facility] Phone number:  [phone] Fax number:  [phone]   Type of Clearance Requested:   - Medical    Type of Anesthesia:   propofol   Additional requests/questions:    Signed, Merry Lofty   01/18/2022, 10:54 AM

## 2022-01-18 NOTE — Telephone Encounter (Signed)
   Name: Alexander Anderson.  DOB: 1945/06/13  MRN: [id number]  Primary Cardiologist: Jenkins Rouge, MD  Chart reviewed as part of pre-operative protocol coverage. Because of Alexander MOFFATT Jr.'s past medical history and time since last visit, he will require a follow-up in-office visit in order to better assess preoperative cardiovascular risk.  Pre-op covering staff: - Please schedule appointment and call patient to inform them. If patient already had an upcoming appointment within acceptable timeframe, please add "pre-op clearance" to the appointment notes so provider is aware. - Please contact requesting surgeon's office via preferred method (i.e, phone, fax) to inform them of need for appointment prior to surgery.   Loel Dubonnet, NP  01/18/2022, 11:58 AM

## 2022-01-18 NOTE — Telephone Encounter (Signed)
Per [name] I called the Alexander Anderson and informed him [name] said he cleared him with no office appt. I s/w the Alexander Anderson and he has been made aware he has been cleared and notes have been sent to Dr. Rhona Raider.

## 2022-01-24 DIAGNOSIS — E291 Testicular hypofunction: Secondary | ICD-10-CM | POA: Diagnosis not present

## 2022-01-24 DIAGNOSIS — R252 Cramp and spasm: Secondary | ICD-10-CM | POA: Diagnosis not present

## 2022-01-24 DIAGNOSIS — R609 Edema, unspecified: Secondary | ICD-10-CM | POA: Diagnosis not present

## 2022-01-24 DIAGNOSIS — Z79899 Other long term (current) drug therapy: Secondary | ICD-10-CM | POA: Diagnosis not present

## 2022-01-24 DIAGNOSIS — Z6827 Body mass index (BMI) 27.0-27.9, adult: Secondary | ICD-10-CM | POA: Diagnosis not present

## 2022-02-01 DIAGNOSIS — E291 Testicular hypofunction: Secondary | ICD-10-CM | POA: Diagnosis not present

## 2022-02-03 DIAGNOSIS — M25511 Pain in right shoulder: Secondary | ICD-10-CM | POA: Diagnosis not present

## 2022-02-07 DIAGNOSIS — M25511 Pain in right shoulder: Secondary | ICD-10-CM | POA: Diagnosis not present

## 2022-02-09 ENCOUNTER — Telehealth: Payer: Self-pay

## 2022-02-09 NOTE — Telephone Encounter (Signed)
Contacted Alexander Anderson per Dr Irene Limbo : to let patient  know is labs are not suggestive of a primary bone marrow problem. Genetic mutation testing neg for clonal mutations. Polycythemia and elevated monocyte counts likely from testosterone use. He should continue to follow with PCP.  No further hematologic intervention recommended. Would recommend lowest effective dose of testosterone to avoid excessive polycythemia that could increase VTE risk.  Alexander Anderson acknowledged information and verbalized understanding.  Also let Alexander Anderson know I had made several attempts to contact him prior to this call.

## 2022-02-10 DIAGNOSIS — M75121 Complete rotator cuff tear or rupture of right shoulder, not specified as traumatic: Secondary | ICD-10-CM | POA: Diagnosis not present

## 2022-02-10 NOTE — Telephone Encounter (Signed)
Requesting office sent over duplicate clearance request. Please see notes from [name]:  Alexander Hector, MD  to [name], [name], RN     01/18/22  4:13 PM Call patient and tell him he is clear for surgery with no office appointment Alexander Hector, MD  to Me  Cv Div Preop  [name], MD     01/18/22  4:12 PM He is clear to have left carpal tunnel surgery with [name] without office visit   Pt was made aware 01/18/22 that he has been cleared per [name]. Clearance notes were faxed to [name] office and surgery scheduler [name]. I will re-fax notes again today to please see notes from [name] he has cleared the pt.

## 2022-02-15 DIAGNOSIS — E291 Testicular hypofunction: Secondary | ICD-10-CM | POA: Diagnosis not present

## 2022-02-17 DIAGNOSIS — M75121 Complete rotator cuff tear or rupture of right shoulder, not specified as traumatic: Secondary | ICD-10-CM | POA: Diagnosis not present

## 2022-02-21 DIAGNOSIS — M7541 Impingement syndrome of right shoulder: Secondary | ICD-10-CM | POA: Diagnosis not present

## 2022-02-21 DIAGNOSIS — X58XXXA Exposure to other specified factors, initial encounter: Secondary | ICD-10-CM | POA: Diagnosis not present

## 2022-02-21 DIAGNOSIS — M24111 Other articular cartilage disorders, right shoulder: Secondary | ICD-10-CM | POA: Diagnosis not present

## 2022-02-21 DIAGNOSIS — S46011A Strain of muscle(s) and tendon(s) of the rotator cuff of right shoulder, initial encounter: Secondary | ICD-10-CM | POA: Diagnosis not present

## 2022-02-21 DIAGNOSIS — M25711 Osteophyte, right shoulder: Secondary | ICD-10-CM | POA: Diagnosis not present

## 2022-02-21 DIAGNOSIS — Y999 Unspecified external cause status: Secondary | ICD-10-CM | POA: Diagnosis not present

## 2022-03-07 DIAGNOSIS — M75120 Complete rotator cuff tear or rupture of unspecified shoulder, not specified as traumatic: Secondary | ICD-10-CM | POA: Diagnosis not present

## 2022-03-07 DIAGNOSIS — E291 Testicular hypofunction: Secondary | ICD-10-CM | POA: Diagnosis not present

## 2022-03-09 DIAGNOSIS — M75120 Complete rotator cuff tear or rupture of unspecified shoulder, not specified as traumatic: Secondary | ICD-10-CM | POA: Diagnosis not present

## 2022-03-13 DIAGNOSIS — M75120 Complete rotator cuff tear or rupture of unspecified shoulder, not specified as traumatic: Secondary | ICD-10-CM | POA: Diagnosis not present

## 2022-03-13 NOTE — Progress Notes (Signed)
Date:  03/22/2022   ID:  Alexander Rea., DOB [birth year], MRN [id number]  PCP:  Lawerance Cruel, MD  Cardiologist:   Johnsie Cancel  Evaluation Performed:  Follow-Up Visit  Chief Complaint:  PVC/HTN   History of Present Illness:    77 y.o.  previously seen by Dr Marlou Porch.  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  Chronic HTN on Rx White coat component Runs good at home  Denies excess NSAI's , ETOH  Compliant with meds.  Does not note PVCls no palpitations or syncope.  Still working in Production assistant, radio Like to ride [place].  Works out pretty regularly with no chest pain     F/U echo :  Reviewed  10/24/19 Normal Aortic root 4.1  Last myovue normal 12/06/2006   Had a subdural hematoma during skiing accident in March 2019 with improvement and no mass effect on last CT done 04/30/17  Son in [place] with [place] and one in [place]  He is still doing Nutritional therapist   CTA 10/22/20 fusiform dilatation aorta 3.9 cm  Hospitalized 8/31-9/2 with SBO  ? [facility] Resolved without surgery   Seen by Dr Vivien Rossetti for monocytosis and polycythema ? From testosterone use Donates blood on occasion to regulate Hct Hct 52 12/13/21 Mutation testing negative    Rehabbing from right rotator cuff surgery with Dr Tamera Punt  Past Medical History:  Diagnosis Date   Adrenal insufficiency ([place])    Cataract    Dysplastic nevus    [name]   HTN (hypertension)    Benign   Hypercholesterolemia    Low testosterone    Lower back pain    PVC's (premature ventricular contractions)    STRESS TEST IN [facility]   Subdural hematoma ([place]) 2019   Past Surgical History:  Procedure Laterality Date   CATARACT EXTRACTION, BILATERAL  2013   Bil   RETINAL DETACHMENT SURGERY     RIGHT EYE   SHOULDER SURGERY  2008   Left, right     Current Meds  Medication Sig   allopurinol (ZYLOPRIM) 100 MG tablet 1 tablet   cholecalciferol (VITAMIN D) 1000 UNITS tablet Take 1,000 Units by  mouth daily.   colchicine 0.6 MG tablet    furosemide (LASIX) 20 MG tablet Take 20 mg by mouth daily.   gabapentin (NEURONTIN) 300 MG capsule Take 300 mg by mouth daily.   L-LYSINE PO Take 1 tablet by mouth daily.    metoprolol tartrate (LOPRESSOR) 25 MG tablet TAKE 1 TABLET BY MOUTH TWICE A DAY   milk thistle 175 MG tablet Take 175 mg by mouth daily.   Misc Natural Products (GLUCOSAMINE CHOND CMP ADVANCED PO) Take by mouth.   Omega-3 Fatty Acids (FISH OIL) 1000 MG CAPS Take 1 capsule by mouth daily.    testosterone cypionate (DEPOTESTOSTERONE CYPIONATE) 200 MG/ML injection Inject 200 mg into the muscle every 14 (fourteen) days.   TURMERIC PO Take 1 tablet by mouth daily. 1050m daily   valsartan (DIOVAN) 320 MG tablet Take 320 mg by mouth daily.   vitamin E 100 UNIT capsule Take 100 Units by mouth daily.     Allergies:   Amlodipine besylate, Ciprofloxacin, Clomiphene, Irbesartan, Oseltamivir, Other, and Penicillins   Social History   Tobacco Use   Smoking status: Never   Smokeless tobacco: Never  Substance Use Topics   Alcohol use: Yes    Alcohol/week: 7.0 standard drinks of alcohol    Types: 7 Standard drinks or  equivalent per week   Drug use: No     Family Hx: The patient's family history includes Atrial fibrillation in his mother; Heart disease in his sister; Ovarian cancer in his mother. There is no history of Dementia.  ROS:   Please see the history of present illness.     All other systems reviewed and are negative.   Prior CV studies:   The following studies were reviewed today:  Echo :  05/08/13   Labs/Other Tests and Data Reviewed:    EKG:    03/22/2022 SR rate 59 normal   Recent Labs: 12/13/2021: ALT 19; BUN 21; Creatinine 1.15; Hemoglobin 17.2; Platelet Count 205; Potassium 4.1; Sodium 137   Recent Lipid Panel No results found for: "CHOL", "TRIG", "HDL", "CHOLHDL", "LDLCALC", "LDLDIRECT"  Wt Readings from Last 3 Encounters:  03/22/22 205 lb (93 kg)   12/13/21 214 lb 11.2 oz (97.4 kg)  01/04/21 204 lb 12.8 oz (92.9 kg)     Objective:    Vital Signs:  BP 102/80   Pulse (!) 59   Ht 6' 2"$  (1.88 m)   Wt 205 lb (93 kg)   SpO2 98%   BMI 26.32 kg/m    Affect appropriate Healthy:  appears stated age HEENT: normal Neck supple with no adenopathy JVP normal no bruits no thyromegaly Lungs clear with no wheezing and good diaphragmatic motion Heart:  S1/S2 no murmur, no rub, gallop or click PMI normal Abdomen: benighn, BS positve, no tenderness, no AAA no bruit.  No HSM or HJR Distal pulses intact with no bruits No edema Neuro non-focal Skin warm and dry Right shoulder in sling from surgery     ASSESSMENT & PLAN:    PVC;s: asymptomatic take mag-oxide with diuretic no structural heart disease by echo in 10/24/19  HTN: improved on lasix now  Low T: continue replacement Lowest dose possible f/u Ross Known secondary elevated monocytes and elevated Hct requiring phlebotomy  Gout: Low red meet and less ETOH f/u Ross consider uloric   Edema:  Dependant lasix as needed compression stockings on motorcycle   Subdural Hematoma: traumatic improved on latest CT f/u neuro   ENT:  Chronic rhinitis f/u Dr Lucia Gaskins Nasacort and clindamycin RX   Aneurysm:  Aortic 3.9 cm f/u CTA September 2024  GI:  SBO August 2022 ? F/u with gastroenterology consider colonoscopy last one 2016  COVID-19 Education: The signs and symptoms of COVID-19 were discussed with the patient and how to seek care for testing (follow up with PCP or arrange E-visit).  The importance of social distancing was discussed today   Medication Adjustments/Labs and Tests Ordered: Current medicines are reviewed at length with the patient today.  Concerns regarding medicines are outlined above.   Tests Ordered:  Gated chest CTA September 2024   Medication Changes:  None  Disposition:  Follow up  in a year    Signed, Jenkins Rouge, MD  03/22/2022 10:01 AM    [place]

## 2022-03-14 DIAGNOSIS — H40053 Ocular hypertension, bilateral: Secondary | ICD-10-CM | POA: Diagnosis not present

## 2022-03-15 DIAGNOSIS — M75120 Complete rotator cuff tear or rupture of unspecified shoulder, not specified as traumatic: Secondary | ICD-10-CM | POA: Diagnosis not present

## 2022-03-21 DIAGNOSIS — M75120 Complete rotator cuff tear or rupture of unspecified shoulder, not specified as traumatic: Secondary | ICD-10-CM | POA: Diagnosis not present

## 2022-03-21 DIAGNOSIS — E291 Testicular hypofunction: Secondary | ICD-10-CM | POA: Diagnosis not present

## 2022-03-22 ENCOUNTER — Ambulatory Visit: Payer: HMO | Attending: Cardiovascular Disease | Admitting: Cardiovascular Disease

## 2022-03-22 ENCOUNTER — Encounter: Payer: Self-pay | Admitting: Cardiovascular Disease

## 2022-03-22 VITALS — BP 102/80 | HR 59 | Ht 74.0 in | Wt 205.0 lb

## 2022-03-22 DIAGNOSIS — I493 Ventricular premature depolarization: Secondary | ICD-10-CM | POA: Diagnosis not present

## 2022-03-22 DIAGNOSIS — I10 Essential (primary) hypertension: Secondary | ICD-10-CM | POA: Diagnosis not present

## 2022-03-22 DIAGNOSIS — I77810 Thoracic aortic ectasia: Secondary | ICD-10-CM

## 2022-03-22 DIAGNOSIS — I712 Thoracic aortic aneurysm, without rupture, unspecified: Secondary | ICD-10-CM | POA: Diagnosis not present

## 2022-03-22 NOTE — Patient Instructions (Addendum)
Medication Instructions:  Your physician recommends that you continue on your current medications as directed. Please refer to the Current Medication list given to you today.  *If you need a refill on your cardiac medications before your next appointment, please call your pharmacy*  Lab Work: Your physician recommends that you return for lab work in: September or August for BMET before your CT. If you have labs (blood work) drawn today and your tests are completely normal, you will receive your results only by: [place] (if you have MyChart) OR A paper copy in the mail If you have any lab test that is abnormal or we need to change your treatment, we will call you to review the results.  Testing/Procedures: Your physician has requested that you have gated CTA in September 2024. Cardiac computed tomography (CT) is a painless test that uses an x-ray machine to take clear, detailed pictures of your heart. For further information please visit [email or web address]. Please follow instruction sheet as given.  Follow-Up: At [facility], you and your health needs are our priority.  As part of our continuing mission to provide you with exceptional heart care, we have created designated Provider Care Teams.  These Care Teams include your primary Cardiologist (physician) and Advanced Practice Providers (APPs -  Physician Assistants and Nurse Practitioners) who all work together to provide you with the care you need, when you need it.  We recommend signing up for the patient portal called "MyChart".  Sign up information is provided on this After Visit Summary.  MyChart is used to connect with patients for Virtual Visits (Telemedicine).  Patients are able to view lab/test results, encounter notes, upcoming appointments, etc.  Non-urgent messages can be sent to your provider as well.   To learn more about what you can do with MyChart, go to [email or web address].    Your next appointment:   1  year(s)  Provider:   Jenkins Rouge, MD

## 2022-03-23 DIAGNOSIS — M75120 Complete rotator cuff tear or rupture of unspecified shoulder, not specified as traumatic: Secondary | ICD-10-CM | POA: Diagnosis not present

## 2022-03-27 DIAGNOSIS — M75120 Complete rotator cuff tear or rupture of unspecified shoulder, not specified as traumatic: Secondary | ICD-10-CM | POA: Diagnosis not present

## 2022-03-29 DIAGNOSIS — M75120 Complete rotator cuff tear or rupture of unspecified shoulder, not specified as traumatic: Secondary | ICD-10-CM | POA: Diagnosis not present

## 2022-04-03 DIAGNOSIS — G5602 Carpal tunnel syndrome, left upper limb: Secondary | ICD-10-CM | POA: Diagnosis not present

## 2022-04-04 DIAGNOSIS — M75120 Complete rotator cuff tear or rupture of unspecified shoulder, not specified as traumatic: Secondary | ICD-10-CM | POA: Diagnosis not present

## 2022-04-04 DIAGNOSIS — E291 Testicular hypofunction: Secondary | ICD-10-CM | POA: Diagnosis not present

## 2022-04-06 DIAGNOSIS — M75120 Complete rotator cuff tear or rupture of unspecified shoulder, not specified as traumatic: Secondary | ICD-10-CM | POA: Diagnosis not present

## 2022-04-11 DIAGNOSIS — M47816 Spondylosis without myelopathy or radiculopathy, lumbar region: Secondary | ICD-10-CM | POA: Diagnosis not present

## 2022-04-11 DIAGNOSIS — M75120 Complete rotator cuff tear or rupture of unspecified shoulder, not specified as traumatic: Secondary | ICD-10-CM | POA: Diagnosis not present

## 2022-04-14 DIAGNOSIS — M75120 Complete rotator cuff tear or rupture of unspecified shoulder, not specified as traumatic: Secondary | ICD-10-CM | POA: Diagnosis not present

## 2022-04-17 DIAGNOSIS — M75120 Complete rotator cuff tear or rupture of unspecified shoulder, not specified as traumatic: Secondary | ICD-10-CM | POA: Diagnosis not present

## 2022-04-21 DIAGNOSIS — M75120 Complete rotator cuff tear or rupture of unspecified shoulder, not specified as traumatic: Secondary | ICD-10-CM | POA: Diagnosis not present

## 2022-04-24 DIAGNOSIS — S2096XA Insect bite (nonvenomous) of unspecified parts of thorax, initial encounter: Secondary | ICD-10-CM | POA: Diagnosis not present

## 2022-04-24 DIAGNOSIS — E291 Testicular hypofunction: Secondary | ICD-10-CM | POA: Diagnosis not present

## 2022-04-24 DIAGNOSIS — W57XXXA Bitten or stung by nonvenomous insect and other nonvenomous arthropods, initial encounter: Secondary | ICD-10-CM | POA: Diagnosis not present

## 2022-04-24 DIAGNOSIS — Z6827 Body mass index (BMI) 27.0-27.9, adult: Secondary | ICD-10-CM | POA: Diagnosis not present

## 2022-04-25 DIAGNOSIS — M75120 Complete rotator cuff tear or rupture of unspecified shoulder, not specified as traumatic: Secondary | ICD-10-CM | POA: Diagnosis not present

## 2022-04-25 DIAGNOSIS — M47816 Spondylosis without myelopathy or radiculopathy, lumbar region: Secondary | ICD-10-CM | POA: Diagnosis not present

## 2022-04-27 DIAGNOSIS — M75120 Complete rotator cuff tear or rupture of unspecified shoulder, not specified as traumatic: Secondary | ICD-10-CM | POA: Diagnosis not present

## 2022-05-02 DIAGNOSIS — M75120 Complete rotator cuff tear or rupture of unspecified shoulder, not specified as traumatic: Secondary | ICD-10-CM | POA: Diagnosis not present

## 2022-05-04 DIAGNOSIS — M75120 Complete rotator cuff tear or rupture of unspecified shoulder, not specified as traumatic: Secondary | ICD-10-CM | POA: Diagnosis not present

## 2022-05-08 DIAGNOSIS — E291 Testicular hypofunction: Secondary | ICD-10-CM | POA: Diagnosis not present

## 2022-05-09 DIAGNOSIS — M75120 Complete rotator cuff tear or rupture of unspecified shoulder, not specified as traumatic: Secondary | ICD-10-CM | POA: Diagnosis not present

## 2022-05-11 DIAGNOSIS — M75120 Complete rotator cuff tear or rupture of unspecified shoulder, not specified as traumatic: Secondary | ICD-10-CM | POA: Diagnosis not present

## 2022-05-16 DIAGNOSIS — M75120 Complete rotator cuff tear or rupture of unspecified shoulder, not specified as traumatic: Secondary | ICD-10-CM | POA: Diagnosis not present

## 2022-05-18 DIAGNOSIS — M75120 Complete rotator cuff tear or rupture of unspecified shoulder, not specified as traumatic: Secondary | ICD-10-CM | POA: Diagnosis not present

## 2022-05-23 DIAGNOSIS — M75120 Complete rotator cuff tear or rupture of unspecified shoulder, not specified as traumatic: Secondary | ICD-10-CM | POA: Diagnosis not present

## 2022-05-23 DIAGNOSIS — E291 Testicular hypofunction: Secondary | ICD-10-CM | POA: Diagnosis not present

## 2022-05-25 DIAGNOSIS — M75120 Complete rotator cuff tear or rupture of unspecified shoulder, not specified as traumatic: Secondary | ICD-10-CM | POA: Diagnosis not present

## 2022-06-02 DIAGNOSIS — M75120 Complete rotator cuff tear or rupture of unspecified shoulder, not specified as traumatic: Secondary | ICD-10-CM | POA: Diagnosis not present

## 2022-06-06 DIAGNOSIS — M47816 Spondylosis without myelopathy or radiculopathy, lumbar region: Secondary | ICD-10-CM | POA: Diagnosis not present

## 2022-06-06 DIAGNOSIS — M75120 Complete rotator cuff tear or rupture of unspecified shoulder, not specified as traumatic: Secondary | ICD-10-CM | POA: Diagnosis not present

## 2022-06-07 DIAGNOSIS — M65332 Trigger finger, left middle finger: Secondary | ICD-10-CM | POA: Diagnosis not present

## 2022-06-07 DIAGNOSIS — E291 Testicular hypofunction: Secondary | ICD-10-CM | POA: Diagnosis not present

## 2022-06-07 DIAGNOSIS — M65342 Trigger finger, left ring finger: Secondary | ICD-10-CM | POA: Diagnosis not present

## 2022-06-08 DIAGNOSIS — M75120 Complete rotator cuff tear or rupture of unspecified shoulder, not specified as traumatic: Secondary | ICD-10-CM | POA: Diagnosis not present

## 2022-06-13 DIAGNOSIS — M75120 Complete rotator cuff tear or rupture of unspecified shoulder, not specified as traumatic: Secondary | ICD-10-CM | POA: Diagnosis not present

## 2022-06-15 DIAGNOSIS — M75120 Complete rotator cuff tear or rupture of unspecified shoulder, not specified as traumatic: Secondary | ICD-10-CM | POA: Diagnosis not present

## 2022-06-19 DIAGNOSIS — E291 Testicular hypofunction: Secondary | ICD-10-CM | POA: Diagnosis not present

## 2022-06-19 DIAGNOSIS — M75120 Complete rotator cuff tear or rupture of unspecified shoulder, not specified as traumatic: Secondary | ICD-10-CM | POA: Diagnosis not present

## 2022-06-27 DIAGNOSIS — M75120 Complete rotator cuff tear or rupture of unspecified shoulder, not specified as traumatic: Secondary | ICD-10-CM | POA: Diagnosis not present

## 2022-06-29 DIAGNOSIS — M75120 Complete rotator cuff tear or rupture of unspecified shoulder, not specified as traumatic: Secondary | ICD-10-CM | POA: Diagnosis not present

## 2022-06-29 DIAGNOSIS — M65341 Trigger finger, right ring finger: Secondary | ICD-10-CM | POA: Diagnosis not present

## 2022-07-04 DIAGNOSIS — M75120 Complete rotator cuff tear or rupture of unspecified shoulder, not specified as traumatic: Secondary | ICD-10-CM | POA: Diagnosis not present

## 2022-07-04 DIAGNOSIS — E291 Testicular hypofunction: Secondary | ICD-10-CM | POA: Diagnosis not present

## 2022-07-05 DIAGNOSIS — G5602 Carpal tunnel syndrome, left upper limb: Secondary | ICD-10-CM | POA: Diagnosis not present

## 2022-07-06 DIAGNOSIS — M75120 Complete rotator cuff tear or rupture of unspecified shoulder, not specified as traumatic: Secondary | ICD-10-CM | POA: Diagnosis not present

## 2022-07-11 DIAGNOSIS — M75120 Complete rotator cuff tear or rupture of unspecified shoulder, not specified as traumatic: Secondary | ICD-10-CM | POA: Diagnosis not present

## 2022-07-13 DIAGNOSIS — M75120 Complete rotator cuff tear or rupture of unspecified shoulder, not specified as traumatic: Secondary | ICD-10-CM | POA: Diagnosis not present

## 2022-07-17 DIAGNOSIS — E291 Testicular hypofunction: Secondary | ICD-10-CM | POA: Diagnosis not present

## 2022-07-18 DIAGNOSIS — M75120 Complete rotator cuff tear or rupture of unspecified shoulder, not specified as traumatic: Secondary | ICD-10-CM | POA: Diagnosis not present

## 2022-07-18 DIAGNOSIS — M722 Plantar fascial fibromatosis: Secondary | ICD-10-CM | POA: Diagnosis not present

## 2022-07-20 DIAGNOSIS — M75120 Complete rotator cuff tear or rupture of unspecified shoulder, not specified as traumatic: Secondary | ICD-10-CM | POA: Diagnosis not present

## 2022-07-25 DIAGNOSIS — M75120 Complete rotator cuff tear or rupture of unspecified shoulder, not specified as traumatic: Secondary | ICD-10-CM | POA: Diagnosis not present

## 2022-07-27 DIAGNOSIS — M75120 Complete rotator cuff tear or rupture of unspecified shoulder, not specified as traumatic: Secondary | ICD-10-CM | POA: Diagnosis not present

## 2022-07-31 DIAGNOSIS — S46011A Strain of muscle(s) and tendon(s) of the rotator cuff of right shoulder, initial encounter: Secondary | ICD-10-CM | POA: Diagnosis not present

## 2022-07-31 DIAGNOSIS — Z09 Encounter for follow-up examination after completed treatment for conditions other than malignant neoplasm: Secondary | ICD-10-CM | POA: Diagnosis not present

## 2022-07-31 DIAGNOSIS — E291 Testicular hypofunction: Secondary | ICD-10-CM | POA: Diagnosis not present

## 2022-08-01 DIAGNOSIS — M75120 Complete rotator cuff tear or rupture of unspecified shoulder, not specified as traumatic: Secondary | ICD-10-CM | POA: Diagnosis not present

## 2022-08-04 DIAGNOSIS — M75120 Complete rotator cuff tear or rupture of unspecified shoulder, not specified as traumatic: Secondary | ICD-10-CM | POA: Diagnosis not present

## 2022-08-11 DIAGNOSIS — M75120 Complete rotator cuff tear or rupture of unspecified shoulder, not specified as traumatic: Secondary | ICD-10-CM | POA: Diagnosis not present

## 2022-08-14 DIAGNOSIS — L700 Acne vulgaris: Secondary | ICD-10-CM | POA: Diagnosis not present

## 2022-08-14 DIAGNOSIS — M65322 Trigger finger, left index finger: Secondary | ICD-10-CM | POA: Diagnosis not present

## 2022-08-14 DIAGNOSIS — M65341 Trigger finger, right ring finger: Secondary | ICD-10-CM | POA: Diagnosis not present

## 2022-08-14 DIAGNOSIS — E291 Testicular hypofunction: Secondary | ICD-10-CM | POA: Diagnosis not present

## 2022-08-14 DIAGNOSIS — L570 Actinic keratosis: Secondary | ICD-10-CM | POA: Diagnosis not present

## 2022-08-15 DIAGNOSIS — M75120 Complete rotator cuff tear or rupture of unspecified shoulder, not specified as traumatic: Secondary | ICD-10-CM | POA: Diagnosis not present

## 2022-08-17 DIAGNOSIS — M75120 Complete rotator cuff tear or rupture of unspecified shoulder, not specified as traumatic: Secondary | ICD-10-CM | POA: Diagnosis not present

## 2022-08-22 DIAGNOSIS — Z79899 Other long term (current) drug therapy: Secondary | ICD-10-CM | POA: Diagnosis not present

## 2022-08-22 DIAGNOSIS — I10 Essential (primary) hypertension: Secondary | ICD-10-CM | POA: Diagnosis not present

## 2022-08-22 DIAGNOSIS — E7800 Pure hypercholesterolemia, unspecified: Secondary | ICD-10-CM | POA: Diagnosis not present

## 2022-08-22 DIAGNOSIS — Z125 Encounter for screening for malignant neoplasm of prostate: Secondary | ICD-10-CM | POA: Diagnosis not present

## 2022-08-22 DIAGNOSIS — M109 Gout, unspecified: Secondary | ICD-10-CM | POA: Diagnosis not present

## 2022-08-22 DIAGNOSIS — E291 Testicular hypofunction: Secondary | ICD-10-CM | POA: Diagnosis not present

## 2022-08-22 DIAGNOSIS — M75120 Complete rotator cuff tear or rupture of unspecified shoulder, not specified as traumatic: Secondary | ICD-10-CM | POA: Diagnosis not present

## 2022-08-24 DIAGNOSIS — E291 Testicular hypofunction: Secondary | ICD-10-CM | POA: Diagnosis not present

## 2022-08-24 DIAGNOSIS — I712 Thoracic aortic aneurysm, without rupture, unspecified: Secondary | ICD-10-CM | POA: Diagnosis not present

## 2022-08-24 DIAGNOSIS — Z7982 Long term (current) use of aspirin: Secondary | ICD-10-CM | POA: Diagnosis not present

## 2022-08-24 DIAGNOSIS — D751 Secondary polycythemia: Secondary | ICD-10-CM | POA: Diagnosis not present

## 2022-08-24 DIAGNOSIS — Z9849 Cataract extraction status, unspecified eye: Secondary | ICD-10-CM | POA: Diagnosis not present

## 2022-08-24 DIAGNOSIS — E7800 Pure hypercholesterolemia, unspecified: Secondary | ICD-10-CM | POA: Diagnosis not present

## 2022-08-24 DIAGNOSIS — G8929 Other chronic pain: Secondary | ICD-10-CM | POA: Diagnosis not present

## 2022-08-24 DIAGNOSIS — M75120 Complete rotator cuff tear or rupture of unspecified shoulder, not specified as traumatic: Secondary | ICD-10-CM | POA: Diagnosis not present

## 2022-08-24 DIAGNOSIS — E663 Overweight: Secondary | ICD-10-CM | POA: Diagnosis not present

## 2022-08-24 DIAGNOSIS — Z0000 Encounter for general adult medical examination without abnormal findings: Secondary | ICD-10-CM | POA: Diagnosis not present

## 2022-08-24 DIAGNOSIS — M109 Gout, unspecified: Secondary | ICD-10-CM | POA: Diagnosis not present

## 2022-08-24 DIAGNOSIS — I10 Essential (primary) hypertension: Secondary | ICD-10-CM | POA: Diagnosis not present

## 2022-08-24 DIAGNOSIS — R972 Elevated prostate specific antigen [PSA]: Secondary | ICD-10-CM | POA: Diagnosis not present

## 2022-08-24 DIAGNOSIS — M4800 Spinal stenosis, site unspecified: Secondary | ICD-10-CM | POA: Diagnosis not present

## 2022-08-29 DIAGNOSIS — E291 Testicular hypofunction: Secondary | ICD-10-CM | POA: Diagnosis not present

## 2022-09-01 DIAGNOSIS — M79641 Pain in right hand: Secondary | ICD-10-CM | POA: Diagnosis not present

## 2022-09-12 DIAGNOSIS — H43812 Vitreous degeneration, left eye: Secondary | ICD-10-CM | POA: Diagnosis not present

## 2022-09-12 DIAGNOSIS — H59813 Chorioretinal scars after surgery for detachment, bilateral: Secondary | ICD-10-CM | POA: Diagnosis not present

## 2022-09-12 DIAGNOSIS — H35371 Puckering of macula, right eye: Secondary | ICD-10-CM | POA: Diagnosis not present

## 2022-09-13 DIAGNOSIS — E291 Testicular hypofunction: Secondary | ICD-10-CM | POA: Diagnosis not present

## 2022-09-19 DIAGNOSIS — M47816 Spondylosis without myelopathy or radiculopathy, lumbar region: Secondary | ICD-10-CM | POA: Diagnosis not present

## 2022-09-22 DIAGNOSIS — K30 Functional dyspepsia: Secondary | ICD-10-CM | POA: Diagnosis not present

## 2022-09-22 DIAGNOSIS — Z6827 Body mass index (BMI) 27.0-27.9, adult: Secondary | ICD-10-CM | POA: Diagnosis not present

## 2022-09-25 DIAGNOSIS — E291 Testicular hypofunction: Secondary | ICD-10-CM | POA: Diagnosis not present

## 2022-10-04 ENCOUNTER — Ambulatory Visit: Payer: HMO | Attending: Cardiovascular Disease

## 2022-10-04 DIAGNOSIS — I712 Thoracic aortic aneurysm, without rupture, unspecified: Secondary | ICD-10-CM | POA: Diagnosis not present

## 2022-10-04 DIAGNOSIS — I77810 Thoracic aortic ectasia: Secondary | ICD-10-CM | POA: Diagnosis not present

## 2022-10-04 LAB — BASIC METABOLIC PANEL
BUN/Creatinine Ratio: 18 (ref 10–24)
BUN: 17 mg/dL (ref 8–27)
CO2: 24 mmol/L (ref 20–29)
Calcium: 9 mg/dL (ref 8.6–10.2)
Chloride: 102 mmol/L (ref 96–106)
Creatinine, Ser: 0.96 mg/dL (ref 0.76–1.27)
Glucose: 73 mg/dL (ref 70–99)
Potassium: 4.5 mmol/L (ref 3.5–5.2)
Sodium: 139 mmol/L (ref 134–144)
eGFR: 82 mL/min/{1.73_m2} (ref >59)

## 2022-10-10 DIAGNOSIS — E291 Testicular hypofunction: Secondary | ICD-10-CM | POA: Diagnosis not present

## 2022-10-16 ENCOUNTER — Ambulatory Visit (HOSPITAL_COMMUNITY)
Admission: RE | Admit: 2022-10-16 | Discharge: 2022-10-16 | Disposition: A | Discharge status: Home / Self Care | Payer: HMO | Source: Ambulatory Visit | Attending: Cardiovascular Disease | Admitting: Cardiovascular Disease

## 2022-10-16 DIAGNOSIS — I77810 Thoracic aortic ectasia: Secondary | ICD-10-CM | POA: Insufficient documentation

## 2022-10-16 DIAGNOSIS — I7121 Aneurysm of the ascending aorta, without rupture: Secondary | ICD-10-CM | POA: Diagnosis not present

## 2022-10-16 DIAGNOSIS — I700 Atherosclerosis of aorta: Secondary | ICD-10-CM | POA: Diagnosis not present

## 2022-10-16 DIAGNOSIS — I712 Thoracic aortic aneurysm, without rupture, unspecified: Secondary | ICD-10-CM | POA: Insufficient documentation

## 2022-10-16 MED ORDER — IOHEXOL 350 MG/ML SOLN
75.0000 mL | Freq: Once | INTRAVENOUS | Status: AC | PRN
  Administered 2022-10-16: 75 mL via INTRAVENOUS

## 2022-10-24 DIAGNOSIS — H43812 Vitreous degeneration, left eye: Secondary | ICD-10-CM | POA: Diagnosis not present

## 2022-10-24 DIAGNOSIS — H35371 Puckering of macula, right eye: Secondary | ICD-10-CM | POA: Diagnosis not present

## 2022-10-24 DIAGNOSIS — E291 Testicular hypofunction: Secondary | ICD-10-CM | POA: Diagnosis not present

## 2022-10-24 DIAGNOSIS — H59813 Chorioretinal scars after surgery for detachment, bilateral: Secondary | ICD-10-CM | POA: Diagnosis not present

## 2022-11-06 DIAGNOSIS — E291 Testicular hypofunction: Secondary | ICD-10-CM | POA: Diagnosis not present

## 2022-11-17 DIAGNOSIS — G5603 Carpal tunnel syndrome, bilateral upper limbs: Secondary | ICD-10-CM | POA: Diagnosis not present

## 2022-11-21 DIAGNOSIS — E291 Testicular hypofunction: Secondary | ICD-10-CM | POA: Diagnosis not present

## 2022-11-30 DIAGNOSIS — M65332 Trigger finger, left middle finger: Secondary | ICD-10-CM | POA: Diagnosis not present

## 2022-11-30 DIAGNOSIS — G5602 Carpal tunnel syndrome, left upper limb: Secondary | ICD-10-CM | POA: Diagnosis not present

## 2022-12-04 DIAGNOSIS — E291 Testicular hypofunction: Secondary | ICD-10-CM | POA: Diagnosis not present

## 2022-12-18 DIAGNOSIS — E291 Testicular hypofunction: Secondary | ICD-10-CM | POA: Diagnosis not present

## 2022-12-26 ENCOUNTER — Ambulatory Visit: Payer: HMO | Admitting: Gastroenterology

## 2023-01-09 DIAGNOSIS — R051 Acute cough: Secondary | ICD-10-CM | POA: Diagnosis not present

## 2023-01-09 DIAGNOSIS — Z03818 Encounter for observation for suspected exposure to other biological agents ruled out: Secondary | ICD-10-CM | POA: Diagnosis not present

## 2023-01-09 DIAGNOSIS — R059 Cough, unspecified: Secondary | ICD-10-CM | POA: Diagnosis not present

## 2023-01-09 DIAGNOSIS — E291 Testicular hypofunction: Secondary | ICD-10-CM | POA: Diagnosis not present

## 2023-01-09 DIAGNOSIS — Z6827 Body mass index (BMI) 27.0-27.9, adult: Secondary | ICD-10-CM | POA: Diagnosis not present

## 2023-01-09 DIAGNOSIS — J069 Acute upper respiratory infection, unspecified: Secondary | ICD-10-CM | POA: Diagnosis not present

## 2023-01-15 DIAGNOSIS — D225 Melanocytic nevi of trunk: Secondary | ICD-10-CM | POA: Diagnosis not present

## 2023-01-15 DIAGNOSIS — L570 Actinic keratosis: Secondary | ICD-10-CM | POA: Diagnosis not present

## 2023-01-15 DIAGNOSIS — Z85820 Personal history of malignant melanoma of skin: Secondary | ICD-10-CM | POA: Diagnosis not present

## 2023-01-15 DIAGNOSIS — L812 Freckles: Secondary | ICD-10-CM | POA: Diagnosis not present

## 2023-01-15 DIAGNOSIS — L821 Other seborrheic keratosis: Secondary | ICD-10-CM | POA: Diagnosis not present

## 2023-01-22 DIAGNOSIS — E291 Testicular hypofunction: Secondary | ICD-10-CM | POA: Diagnosis not present

## 2023-02-05 DIAGNOSIS — E291 Testicular hypofunction: Secondary | ICD-10-CM | POA: Diagnosis not present

## 2023-04-16 DIAGNOSIS — M47816 Spondylosis without myelopathy or radiculopathy, lumbar region: Secondary | ICD-10-CM | POA: Diagnosis not present

## 2023-04-30 DIAGNOSIS — E291 Testicular hypofunction: Secondary | ICD-10-CM | POA: Diagnosis not present

## 2023-05-08 DIAGNOSIS — H43392 Other vitreous opacities, left eye: Secondary | ICD-10-CM | POA: Diagnosis not present

## 2023-05-08 DIAGNOSIS — H43812 Vitreous degeneration, left eye: Secondary | ICD-10-CM | POA: Diagnosis not present

## 2023-05-08 DIAGNOSIS — H59813 Chorioretinal scars after surgery for detachment, bilateral: Secondary | ICD-10-CM | POA: Diagnosis not present

## 2023-05-08 DIAGNOSIS — H35371 Puckering of macula, right eye: Secondary | ICD-10-CM | POA: Diagnosis not present

## 2023-05-14 DIAGNOSIS — E291 Testicular hypofunction: Secondary | ICD-10-CM | POA: Diagnosis not present

## 2023-05-16 DIAGNOSIS — S6991XA Unspecified injury of right wrist, hand and finger(s), initial encounter: Secondary | ICD-10-CM | POA: Diagnosis not present

## 2023-05-25 ENCOUNTER — Other Ambulatory Visit: Payer: Self-pay

## 2023-05-25 MED ORDER — METOPROLOL TARTRATE 25 MG PO TABS: 25.0000 mg | ORAL_TABLET | Freq: Two times a day (BID) | ORAL | 0 refills | Status: DC

## 2023-05-28 DIAGNOSIS — E291 Testicular hypofunction: Secondary | ICD-10-CM | POA: Diagnosis not present

## 2023-05-29 DIAGNOSIS — L570 Actinic keratosis: Secondary | ICD-10-CM | POA: Diagnosis not present

## 2023-06-05 DIAGNOSIS — M47816 Spondylosis without myelopathy or radiculopathy, lumbar region: Secondary | ICD-10-CM | POA: Diagnosis not present

## 2023-06-06 DIAGNOSIS — M79644 Pain in right finger(s): Secondary | ICD-10-CM | POA: Diagnosis not present

## 2023-06-06 DIAGNOSIS — M65352 Trigger finger, left little finger: Secondary | ICD-10-CM | POA: Diagnosis not present

## 2023-06-18 DIAGNOSIS — E291 Testicular hypofunction: Secondary | ICD-10-CM | POA: Diagnosis not present

## 2023-06-20 ENCOUNTER — Other Ambulatory Visit: Payer: Self-pay | Admitting: Cardiovascular Disease

## 2023-07-03 ENCOUNTER — Other Ambulatory Visit: Payer: Self-pay | Admitting: Cardiovascular Disease

## 2023-07-03 DIAGNOSIS — E291 Testicular hypofunction: Secondary | ICD-10-CM | POA: Diagnosis not present

## 2023-07-07 ENCOUNTER — Other Ambulatory Visit: Payer: Self-pay | Admitting: Cardiovascular Disease

## 2023-07-17 DIAGNOSIS — E291 Testicular hypofunction: Secondary | ICD-10-CM | POA: Diagnosis not present

## 2023-07-19 ENCOUNTER — Other Ambulatory Visit: Payer: Self-pay | Admitting: Cardiovascular Disease

## 2023-07-26 ENCOUNTER — Telehealth: Payer: Self-pay | Admitting: Cardiovascular Disease

## 2023-07-26 NOTE — Telephone Encounter (Signed)
 Called patient back about message. Patient stated a nurse from [employer] with Medicare did an annual exam and she was concerned about his bilateral ankle pitting edema, and wanted patient to call his cardiologist.  Patient stated this is not new, but it has gotten a little bit worse. Patient stated he wears compression socks doing yard work, driving long distances, and whenever necessary. Patient is currently on lasix 20 mg by mouth daily.  Patient stated the swelling usually goes down over night, but the last couple of days it has not. Will send message to Dr. Nishan for advisement.

## 2023-07-26 NOTE — Telephone Encounter (Signed)
 Pt c/o swelling/edema: STAT if pt has developed SOB within 24 hours  If swelling, where is the swelling located?  Both ankles  How much weight have you gained and in what time span?   No  Have you gained 2 pounds in a day or 5 pounds in a week?   No  Do you have a log of your daily weights (if so, list)?   No  Are you currently taking a fluid pill?   Yes  Are you currently SOB?  No  Have you traveled recently in a car or plane for an extended period of time?  Yes  Patient stated he is concerned regarding addressing the swelling in his ankles.  Patient stated he wears support stockings when he travels long distance.

## 2023-07-27 NOTE — Telephone Encounter (Signed)
 Called patient with Dr. Stann Earnest advisement. Patient will see PA next month.

## 2023-07-30 DIAGNOSIS — E291 Testicular hypofunction: Secondary | ICD-10-CM | POA: Diagnosis not present

## 2023-07-30 DIAGNOSIS — Z23 Encounter for immunization: Secondary | ICD-10-CM | POA: Diagnosis not present

## 2023-08-13 ENCOUNTER — Other Ambulatory Visit: Payer: Self-pay | Admitting: Cardiovascular Disease

## 2023-08-20 DIAGNOSIS — E291 Testicular hypofunction: Secondary | ICD-10-CM | POA: Diagnosis not present

## 2023-08-26 NOTE — Progress Notes (Deleted)
 Cardiology Office Note:    Date:  08/26/2023   ID:  Alexander Anderson., DOB Jul 17, 1945, MRN [id number]  PCP:  Okey Carlin Redbird, MD  Cardiologist:  Maude Emmer, MD { Click to update primary MD,subspecialty MD or APP then REFRESH:1}    Referring MD: Okey Carlin Redbird, MD   Chief Complaint: follow-up of PVCs and hypertension  History of Present Illness:    Alexander Anderson. is a 78 y.o. male with a history of PVCs, ascending thoracic aortic aneurysm, hypertension, hyperlipidemia, traumatic subdural hematoma in 2019 following skiing accident, chronic back pain, gout and low testosterone  who is followed by Dr. Emmer and presents today for routine follow-up.   Patient follows with Cardiology primarily for PVCs and hypertension. Per chart review, she has a component of white coat syndrome but BP typically runs well at home. Remote Myoview in 2008 was negative. Last Echo in 2021 showed LVEF of 60-65% with normal wall motion and grade 1 diastolic dysfunction, normal RV function, and mild dilatation of the ascending aorta measuring 41 mm.   She was last seen by Dr. Emmer in 03/2022 showed at which time she was stable from a cardiac standpoint.   Routine chest CTA in 10/2022 for monitoring of dilated aorta showed a minimal interval increase of the ascending thoracic aortic aneurysm from 3.9 cm in 2022 to 4.0 cm.   Patient presents today for follow-up. ***  PVCs Stable. No significant palpitations.  - Continue Lopressor  25mg  twice daily.   Ascending Thoracic Aortic Aneurysm Chest CTA in 10/2022 showed a minimal interval increase of the ascending thoracic aortic aneurysm from 3.9 cm in 2022 to 4.0 cm.  - Will repeat chest CTA in 10/2023. ***  Chronic Lower Extremity Edema Previously felt to have dependent edema.  - Stable. *** - Continue Lasix 20mg  daily.  - Continue compression stockings. ***  Hypertension BP well controlled. *** - Continue current medications: Valsartan 325mg  daily  and Lopressor  25mg  twice daily.   Hyperlipidemia History of hyperlipidemia listed in her chart but not on any medications. Last lipid panel in *** - ***   EKGs/Labs/Other Studies Reviewed:    The following studies were reviewed:  Echocardiogram 10/24/2019: Impressions: 1. Left ventricular ejection fraction, by estimation, is 60 to 65%. The  left ventricle has normal function. The left ventricle has no regional  wall motion abnormalities. Left ventricular diastolic parameters are  consistent with Grade I diastolic  dysfunction (impaired relaxation). The average left ventricular global  longitudinal strain is -16.8 %. The global longitudinal strain is normal.   2. Right ventricular systolic function is normal. The right ventricular  size is normal. Tricuspid regurgitation signal is inadequate for assessing  PA pressure.   3. The mitral valve is normal in structure. No evidence of mitral valve  regurgitation. No evidence of mitral stenosis.   4. The aortic valve is normal in structure. Aortic valve regurgitation is  not visualized. No aortic stenosis is present.   5. Aortic dilatation noted. There is mild dilatation of the ascending  aorta, measuring 41 mm.   6. The inferior vena cava is normal in size with greater than 50%  respiratory variability, suggesting right atrial pressure of 3 mmHg.  _______________  Chest CTA 10/16/2022: Impressions: 1. Minimal interval increase of ascending thoracic aortic fusiform aneurysm to 4.0 cm from 3.9 cm since 2022. Recommend annual imaging followup by CTA or MRA. This recommendation follows 2010 ACCF/AHA/AATS/ACR/ASA/SCA/SCAI/SIR/STS/SVM Guidelines for the Diagnosis and Management of Patients with  Thoracic Aortic Disease. Circulation. 2010; 121: Z733-z630. Aortic aneurysm NOS (ICD10-I71.9) 2.  Aortic Atherosclerosis (ICD10-I70.0).  EKG:  EKG ordered today. EKG personally reviewed and demonstrates ***.  Recent Labs: 10/04/2022: BUN 17;  Creatinine, Ser 0.96; Potassium 4.5; Sodium 139  Recent Lipid Panel No results found for: CHOL, TRIG, HDL, CHOLHDL, VLDL, LDLCALC, LDLDIRECT  Physical Exam:    Vital Signs: There were no vitals taken for this visit.    Wt Readings from Last 3 Encounters:  03/22/22 205 lb (93 kg)  12/13/21 214 lb 11.2 oz (97.4 kg)  01/04/21 204 lb 12.8 oz (92.9 kg)     General: 78 y.o. male in no acute distress. HEENT: Normocephalic and atraumatic. Sclera clear.  Neck: Supple. No carotid bruits. No JVD. Heart: *** RRR. Distinct S1 and S2. No murmurs, gallops, or rubs.  Lungs: No increased work of breathing. Clear to ausculation bilaterally. No wheezes, rhonchi, or rales.  Abdomen: Soft, non-distended, and non-tender to palpation.  Extremities: No lower extremity edema.  Radial and distal pedal pulses 2+ and equal bilaterally. Skin: Warm and dry. Neuro: No focal deficits. Psych: Normal affect. Responds appropriately.   Assessment:    No diagnosis found.  Plan:     Disposition: Follow up in ***   Signed, Aline FORBES Door, PA-C  08/26/2023 7:11 PM    [facility]

## 2023-08-28 DIAGNOSIS — M109 Gout, unspecified: Secondary | ICD-10-CM | POA: Diagnosis not present

## 2023-08-28 DIAGNOSIS — E7800 Pure hypercholesterolemia, unspecified: Secondary | ICD-10-CM | POA: Diagnosis not present

## 2023-08-28 DIAGNOSIS — I10 Essential (primary) hypertension: Secondary | ICD-10-CM | POA: Diagnosis not present

## 2023-08-28 DIAGNOSIS — E291 Testicular hypofunction: Secondary | ICD-10-CM | POA: Diagnosis not present

## 2023-08-28 DIAGNOSIS — D751 Secondary polycythemia: Secondary | ICD-10-CM | POA: Diagnosis not present

## 2023-08-28 DIAGNOSIS — Z125 Encounter for screening for malignant neoplasm of prostate: Secondary | ICD-10-CM | POA: Diagnosis not present

## 2023-09-02 IMAGING — MR MR HEAD WO/W CM
13 of 14 series · 44 of 48 positions shown · IV contrast (Multihance 15ml)
Comparison: Prior head CT examinations 04/30/2017 and earlier.

CLINICAL DATA: Asymmetric sensorineural hearing loss. Additional
history provided by scanning technologist: Patient reports sudden
hearing loss left ear four months ago. History of subdural hematoma
8515.

EXAM:
MRI HEAD WITHOUT AND WITH CONTRAST
TECHNIQUE: Multiplanar, multiecho pulse sequences of the brain and surrounding
structures were obtained without and with intravenous contrast.
CONTRAST:  15mL MULTIHANCE GADOBENATE DIMEGLUMINE 529 MG/ML IV SOLN

[Series 5: T1 · sagittal · 4.0mm · 0.75mm/px · 1 of 31 slices shown (1 of 3)]
[im 1/31]
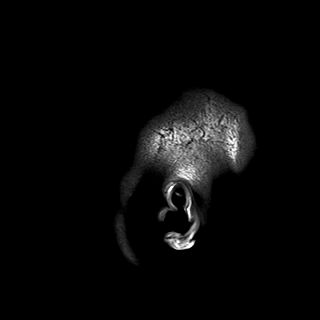

[Series 6: DWI · axial · 3.0mm · 0.94mm/px · z∈[-11,+123]mm · 8 of 160 slices shown]
[im 1/160]
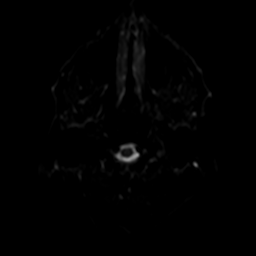
[im 23/160]
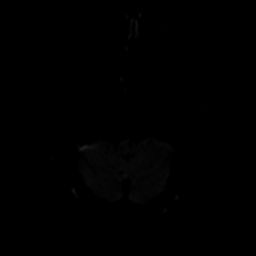
[im 46/160]
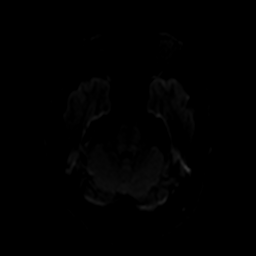
[im 69/160]
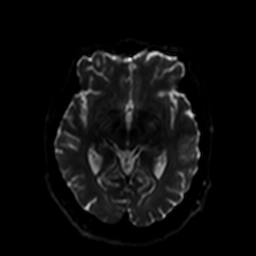
[im 91/160]
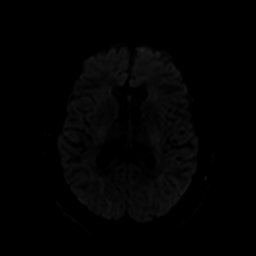
[im 114/160]
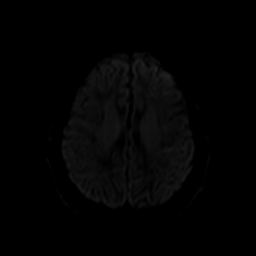
[im 137/160]
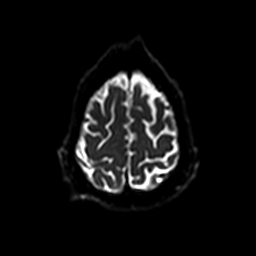
[im 160/160]
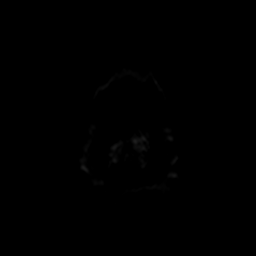

[Series 7: ax dwi_tracew · axial · 3.0mm · 0.94mm/px · z∈[-11,+123]mm · 5 of 80 slices shown]
[im 1/80]
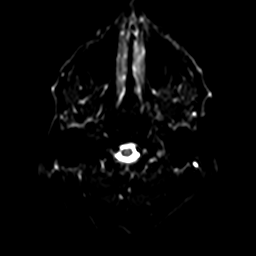
[im 20/80]
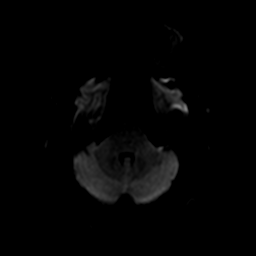
[im 40/80]
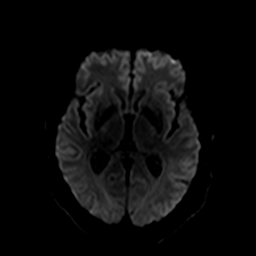
[im 60/80]
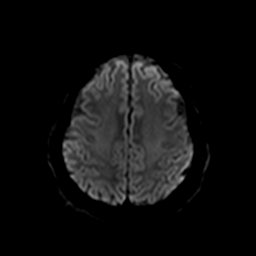
[im 80/80]
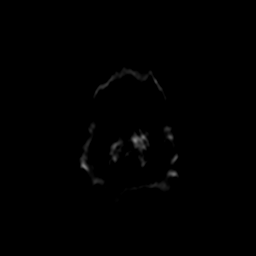

[Series 8: ax dwi_adc · axial · 3.0mm · 0.94mm/px · z∈[-11,+123]mm · 2 of 40 slices shown]
[im 1/40]
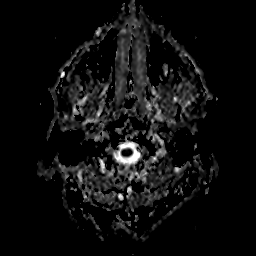
[im 40/40]
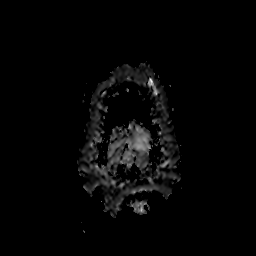

[Series 9: T2 · axial · 4.0mm · 0.36mm/px · z∈[-19,+121]mm · 2 of 29 slices shown]
[im 1/29]
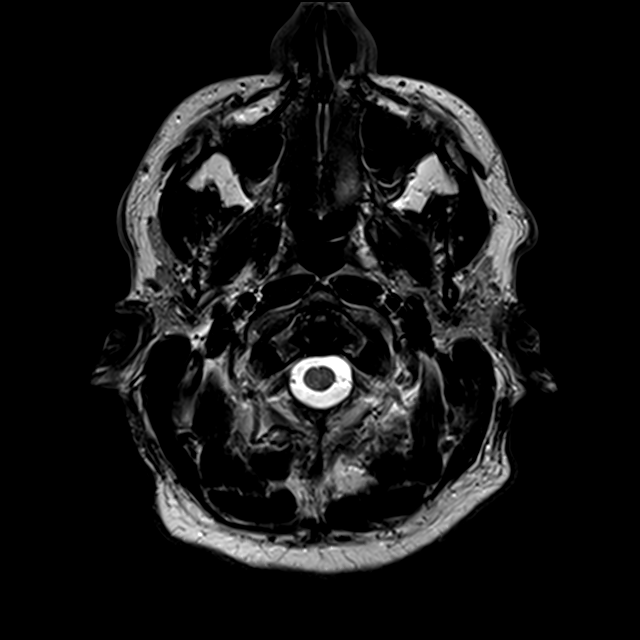
[im 29/29]
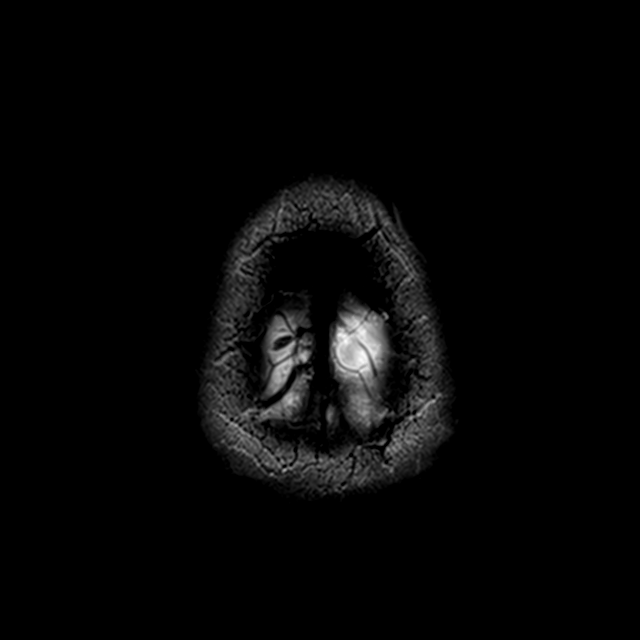

[Series 10: FLAIR · axial · 3.0mm · 0.72mm/px · z∈[-27,+129]mm · 2 of 28 slices shown]
[im 1/28]
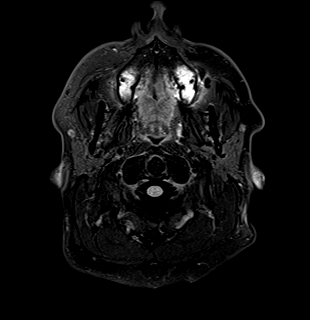
[im 28/28]
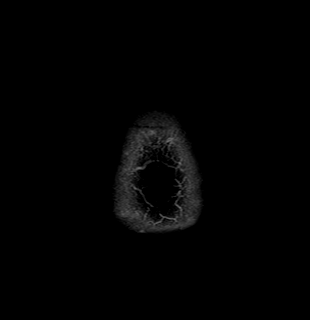

[Series 11: T1 · coronal · 3.0mm · 0.56mm/px · 1 of 13 slices shown (2 of 3)]
[im 1/13]
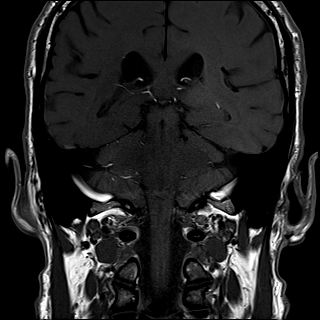

[Series 12: swi_images · axial · 1.5mm · 0.90mm/px · z∈[-19,+118]mm · 6 of 96 slices shown]
[im 1/96]
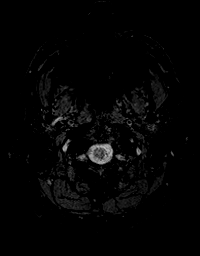
[im 20/96]
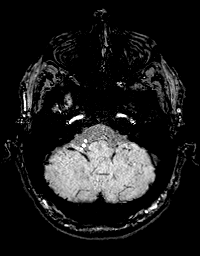
[im 39/96]
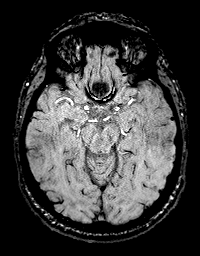
[im 58/96]
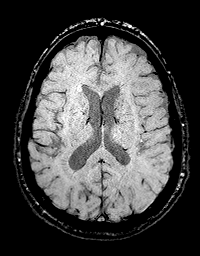
[im 77/96]
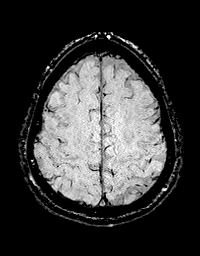
[im 96/96]
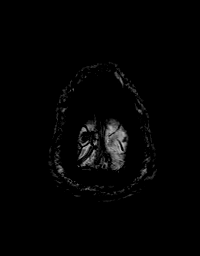

[Series 13: mip_images(sw) · axial · 12.0mm · 0.90mm/px · z∈[-14,+113]mm · 5 of 89 slices shown]
[im 1/89]
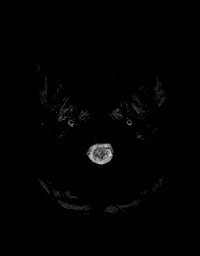
[im 23/89]
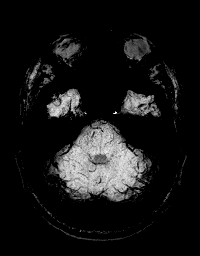
[im 45/89]
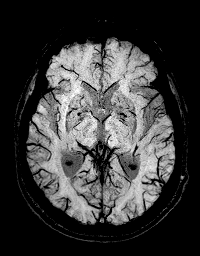
[im 67/89]
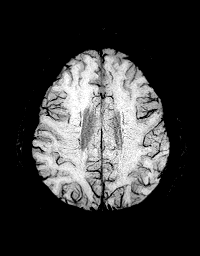
[im 89/89]
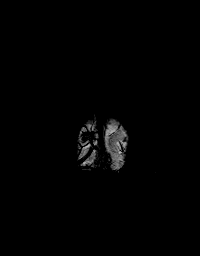

[Series 14: T1 · axial · 3.0mm · 0.50mm/px · 1 of 13 slices shown (3 of 3)]
[im 1/13]
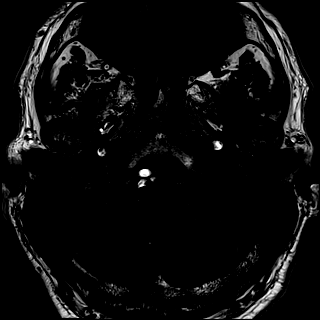

[Series 16: T1 post-contrast · coronal · 3.0mm · 0.56mm/px · 1 of 13 slices shown (1 of 3)]
[im 1/13]
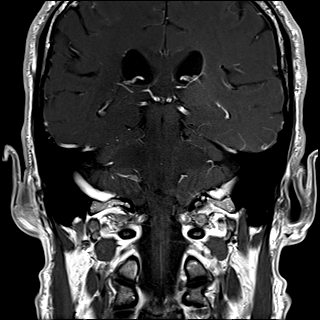

[Series 17: T1 post-contrast · axial · 3.0mm · 0.50mm/px · 1 of 13 slices shown (2 of 3)]
[im 1/13]
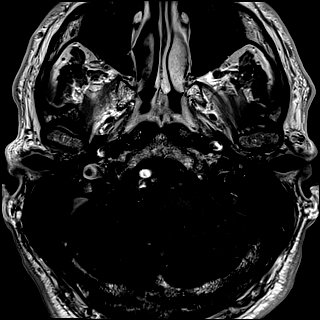

[Series 18: T1 post-contrast · axial · 1.0mm · 0.90mm/px · z∈[-27,+126]mm · 9 of 160 slices shown (3 of 3)]
[im 1/160]
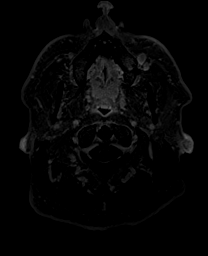
[im 20/160]
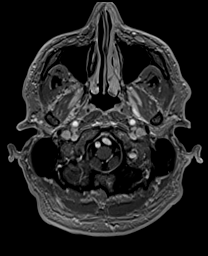
[im 40/160]
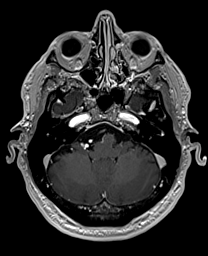
[im 60/160]
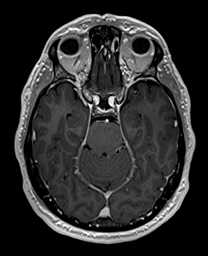
[im 80/160]
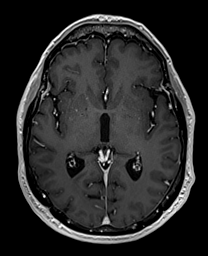
[im 100/160]
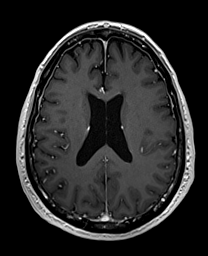
[im 120/160]
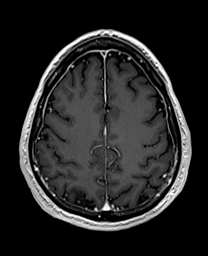
[im 140/160]
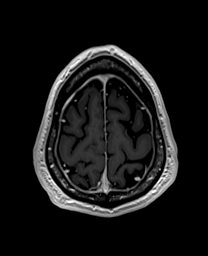
[im 160/160]
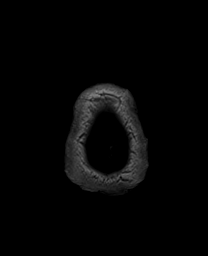

[44 of 48 positions shown; findings below may reference images not displayed]

FINDINGS: Brain:

Cerebral volume is normal.

Chronic lacunar infarct within the right corona radiata (series 10,
image 19).

Background mild multifocal T2/FLAIR hyperintensity within the
cerebral white matter, nonspecific but compatible with chronic small
vessel ischemic disease.

No evidence of an intracranial mass. Specifically, no
cerebellopontine angle or internal auditory canal mass is
demonstrated. Unremarkable appearance of the seventh and eighth
cranial nerves bilaterally.

There is no acute infarct.

No definite chronic intracranial blood products are identified.

No extra-axial fluid collection.

No midline shift.

No pathologic intracranial enhancement.

Vascular: Maintained flow voids within the proximal large arterial
vessels.

Skull and upper cervical spine: No focal suspicious marrow lesion.

Sinuses/Orbits: Visualized orbits show no acute finding. Bilateral
lens replacements. Trace bilateral ethmoid sinus mucosal thickening.
IMPRESSION: No evidence of acute intracranial abnormality.

No cerebellopontine angle or internal auditory canal mass.

Chronic lacunar infarct within the right corona radiata.

Background mild chronic small vessel ischemic changes within the
cerebral white matter.

## 2023-09-03 DIAGNOSIS — M4800 Spinal stenosis, site unspecified: Secondary | ICD-10-CM | POA: Diagnosis not present

## 2023-09-03 DIAGNOSIS — M109 Gout, unspecified: Secondary | ICD-10-CM | POA: Diagnosis not present

## 2023-09-03 DIAGNOSIS — Z0000 Encounter for general adult medical examination without abnormal findings: Secondary | ICD-10-CM | POA: Diagnosis not present

## 2023-09-03 DIAGNOSIS — I10 Essential (primary) hypertension: Secondary | ICD-10-CM | POA: Diagnosis not present

## 2023-09-03 DIAGNOSIS — Z6828 Body mass index (BMI) 28.0-28.9, adult: Secondary | ICD-10-CM | POA: Diagnosis not present

## 2023-09-03 DIAGNOSIS — E291 Testicular hypofunction: Secondary | ICD-10-CM | POA: Diagnosis not present

## 2023-09-03 DIAGNOSIS — M179 Osteoarthritis of knee, unspecified: Secondary | ICD-10-CM | POA: Diagnosis not present

## 2023-09-03 DIAGNOSIS — Z79899 Other long term (current) drug therapy: Secondary | ICD-10-CM | POA: Diagnosis not present

## 2023-09-04 DIAGNOSIS — E291 Testicular hypofunction: Secondary | ICD-10-CM | POA: Diagnosis not present

## 2023-09-04 NOTE — Progress Notes (Unsigned)
 Cardiology Office Note:  .   Date:  09/06/2023  ID:  Alexander Anderson., DOB 09/20/1945, MRN [id number] PCP: Alexander Carlin Redbird, MD  [facility] Providers Cardiologist:  Maude Emmer, MD {  History of Present Illness: .   Alexander Min. is a 78 y.o. male with a history of PVCs, ascending thoracic aortic aneurysm, hypertension, traumatic subdural hematoma in 2019 following skiing accident, gout and low testosterone .    Cardiac workup Normal Myoview 2008 Most recent echocardiogram 10/2019 normal biventricular function, mild dilatation of ascending aorta, 41 mm. CTA 10/2022, minimal interval increase of ascending thoracic aorta 3.9 to 4.0 cm.  PVCs Reported to be chronic/asymptomatic, do not see heart monitor has ever been ordered.  Social history  Does Marketing executive, rides a motorbike Occasionally drinks bourbon     Patient with history of asymptomatic PVCs and stable dilatation of ascending aorta.  Last seen by cardiology 03/2022 without any acute complaints.  Today he is here for annual follow-up.  Reports some mild complaints of fatigue that have been going on for years.  He feels it may be attributed to the weather.  Reports that normally he can mow his lawn without stopping however now has to also take a break.  Reports very mild swelling in his lower extremities in which she takes Lasix 20 mg.  Reports no orthopnea, chest pain, shortness of breath.  Still remains active and goes out golfing.    ROS: Denies: Chest pain, shortness of breath, orthopnea, peripheral edema, palpitations, decreased exercise intolerance, fatigue, lightheadedness.   Studies Reviewed: SABRA    EKG Interpretation Date/Time:  Thursday September 06 2023 11:03:20 EDT Ventricular Rate:  53 PR Interval:  186 QRS Duration:  106 QT Interval:  380 QTC Calculation: 356 R Axis:   -5  Text Interpretation: Sinus bradycardia Minimal voltage criteria for LVH, may be normal variant ( [employer] product ) When  compared with ECG of 06-Oct-2020 11:48, PREVIOUS ECG IS PRESENT Confirmed by Darryle Currier [phone]) on 09/06/2023 11:07:46 AM    Risk Assessment/Calculations:        Physical Exam:   VS:  BP (!) 142/98 (BP Location: Left Arm, Patient Position: Sitting, Cuff Size: Normal)   Pulse (!) 53   Ht 6' 2 (1.88 m)   Wt 210 lb (95.3 kg)   SpO2 95%   BMI 26.96 kg/m    Wt Readings from Last 3 Encounters:  09/06/23 210 lb (95.3 kg)  03/22/22 205 lb (93 kg)  12/13/21 214 lb 11.2 oz (97.4 kg)    GEN: Well nourished, well developed in no acute distress NECK: No JVD; No carotid bruits CARDIAC: RRR, no murmurs, rubs, gallops RESPIRATORY:  Clear to auscultation without rales, wheezing or rhonchi  ABDOMEN: Soft, non-tender, non-distended EXTREMITIES:  No edema; No deformity   ASSESSMENT AND PLAN: .    Fatigue He is followed by Margarete, just brought in recent lab work that was reassuring.  Low suspicion for this being cardiac related but he is due for echocardiogram for ascending thoracic aorta aneurysm so will reorder.  Also with mild peripheral edema.  No clear signs of CHF.  Further evaluation can be done by his PCP.  PVCs Reported be asymptomatic, has deferred heart monitor for this.  Normal echocardiogram 10/2019. Reports he actually takes Lopressor  just once a day.  Ascending thoracic aortic aneurysm Chest CT 10/2022 demonstrating stable disease, 4.0 cm. Reordering echocardiogram  Hypertension Slightly elevated today but he takes this at home  and is well-controlled. Continue with valsartan 325 mg and Lopressor  25 mg   Low testosterone  Followed by PCP.  Labs look within normal limits recently.       Dispo: 1 year follow-up with Dr. Delford as long as echo is normal.  Signed, Thom LITTIE Sluder, PA-C

## 2023-09-06 ENCOUNTER — Ambulatory Visit: Attending: Student | Admitting: Cardiology

## 2023-09-06 VITALS — BP 142/98 | HR 53 | Ht 74.0 in | Wt 210.0 lb

## 2023-09-06 DIAGNOSIS — I493 Ventricular premature depolarization: Secondary | ICD-10-CM | POA: Diagnosis not present

## 2023-09-06 DIAGNOSIS — I77810 Thoracic aortic ectasia: Secondary | ICD-10-CM | POA: Diagnosis not present

## 2023-09-06 DIAGNOSIS — I10 Essential (primary) hypertension: Secondary | ICD-10-CM

## 2023-09-06 DIAGNOSIS — E782 Mixed hyperlipidemia: Secondary | ICD-10-CM

## 2023-09-06 DIAGNOSIS — R5383 Other fatigue: Secondary | ICD-10-CM | POA: Diagnosis not present

## 2023-09-06 MED ORDER — METOPROLOL TARTRATE 25 MG PO TABS: 25.0000 mg | ORAL_TABLET | Freq: Every day | ORAL | 3 refills | Status: AC

## 2023-09-06 NOTE — Patient Instructions (Signed)
 Medication Instructions:  Your physician recommends that you continue on your current medications as directed. Please refer to the Current Medication list given to you today.  *If you need a refill on your cardiac medications before your next appointment, please call your pharmacy*  Testing/Procedures: Your physician has requested that you have an echocardiogram. Echocardiography is a painless test that uses sound waves to create images of your heart. It provides your doctor with information about the size and shape of your heart and how well your heart's chambers and valves are working. This procedure takes approximately one hour. There are no restrictions for this procedure. Please do NOT wear cologne, perfume, aftershave, or lotions (deodorant is allowed). Please arrive 15 minutes prior to your appointment time.  Please note: We ask at that you not bring children with you during ultrasound (echo/ vascular) testing. Due to room size and safety concerns, children are not allowed in the ultrasound rooms during exams. Our front office staff cannot provide observation of children in our lobby area while testing is being conducted. An adult accompanying a patient to their appointment will only be allowed in the ultrasound room at the discretion of the ultrasound technician under special circumstances. We apologize for any inconvenience.  Follow-Up: At [facility], you and your health needs are our priority.  As part of our continuing mission to provide you with exceptional heart care, our providers are all part of one team.  This team includes your primary Cardiologist (physician) and Advanced Practice Providers or APPs (Physician Assistants and Nurse Practitioners) who all work together to provide you with the care you need, when you need it.  Your next appointment:   12 month(s)  Provider:   Janelle Mediate, MD   We recommend signing up for the patient portal called "MyChart".  Sign up  information is provided on this After Visit Summary.  MyChart is used to connect with patients for Virtual Visits (Telemedicine).  Patients are able to view lab/test results, encounter notes, upcoming appointments, etc.  Non-urgent messages can be sent to your provider as well.   To learn more about what you can do with MyChart, go to [email or web address].

## 2023-09-12 DIAGNOSIS — L738 Other specified follicular disorders: Secondary | ICD-10-CM | POA: Diagnosis not present

## 2023-09-12 DIAGNOSIS — L570 Actinic keratosis: Secondary | ICD-10-CM | POA: Diagnosis not present

## 2023-09-17 DIAGNOSIS — M1711 Unilateral primary osteoarthritis, right knee: Secondary | ICD-10-CM | POA: Diagnosis not present

## 2023-09-17 DIAGNOSIS — M25561 Pain in right knee: Secondary | ICD-10-CM | POA: Diagnosis not present

## 2023-09-18 DIAGNOSIS — E291 Testicular hypofunction: Secondary | ICD-10-CM | POA: Diagnosis not present

## 2023-09-19 DIAGNOSIS — M1711 Unilateral primary osteoarthritis, right knee: Secondary | ICD-10-CM | POA: Diagnosis not present

## 2023-10-01 DIAGNOSIS — E291 Testicular hypofunction: Secondary | ICD-10-CM | POA: Diagnosis not present

## 2023-10-16 ENCOUNTER — Other Ambulatory Visit (HOSPITAL_COMMUNITY)

## 2023-10-17 DIAGNOSIS — E291 Testicular hypofunction: Secondary | ICD-10-CM | POA: Diagnosis not present

## 2023-10-25 DIAGNOSIS — Z6827 Body mass index (BMI) 27.0-27.9, adult: Secondary | ICD-10-CM | POA: Diagnosis not present

## 2023-10-25 DIAGNOSIS — R197 Diarrhea, unspecified: Secondary | ICD-10-CM | POA: Diagnosis not present

## 2023-10-25 DIAGNOSIS — R252 Cramp and spasm: Secondary | ICD-10-CM | POA: Diagnosis not present

## 2023-10-25 DIAGNOSIS — Z09 Encounter for follow-up examination after completed treatment for conditions other than malignant neoplasm: Secondary | ICD-10-CM | POA: Diagnosis not present

## 2023-10-29 ENCOUNTER — Other Ambulatory Visit (HOSPITAL_COMMUNITY)

## 2023-10-31 DIAGNOSIS — E291 Testicular hypofunction: Secondary | ICD-10-CM | POA: Diagnosis not present

## 2023-11-02 ENCOUNTER — Ambulatory Visit: Payer: Self-pay | Admitting: Physician Assistant

## 2023-11-02 ENCOUNTER — Ambulatory Visit (HOSPITAL_COMMUNITY)
Admission: RE | Admit: 2023-11-02 | Discharge: 2023-11-02 | Disposition: A | Discharge status: Home / Self Care | Source: Ambulatory Visit | Attending: Cardiology | Admitting: Cardiology

## 2023-11-02 DIAGNOSIS — I10 Essential (primary) hypertension: Secondary | ICD-10-CM | POA: Insufficient documentation

## 2023-11-02 DIAGNOSIS — I77810 Thoracic aortic ectasia: Secondary | ICD-10-CM | POA: Insufficient documentation

## 2023-11-02 LAB — ECHOCARDIOGRAM COMPLETE
AR max vel: 3.43 cm2
AV Area VTI: 3.52 cm2
AV Area mean vel: 3.26 cm2
AV Mean grad: 4 mmHg
AV Peak grad: 7.8 mmHg
Ao pk vel: 1.4 m/s
Area-P 1/2: 4.29 cm2
S' Lateral: 3.4 cm

## 2023-11-14 DIAGNOSIS — E291 Testicular hypofunction: Secondary | ICD-10-CM | POA: Diagnosis not present

## 2023-11-26 DIAGNOSIS — M47816 Spondylosis without myelopathy or radiculopathy, lumbar region: Secondary | ICD-10-CM | POA: Diagnosis not present

## 2023-11-26 DIAGNOSIS — M5416 Radiculopathy, lumbar region: Secondary | ICD-10-CM | POA: Diagnosis not present

## 2023-11-27 DIAGNOSIS — E291 Testicular hypofunction: Secondary | ICD-10-CM | POA: Diagnosis not present

## 2023-12-10 DIAGNOSIS — M48062 Spinal stenosis, lumbar region with neurogenic claudication: Secondary | ICD-10-CM | POA: Diagnosis not present

## 2023-12-11 DIAGNOSIS — M5416 Radiculopathy, lumbar region: Secondary | ICD-10-CM | POA: Diagnosis not present

## 2023-12-11 DIAGNOSIS — M48062 Spinal stenosis, lumbar region with neurogenic claudication: Secondary | ICD-10-CM | POA: Diagnosis not present

## 2023-12-11 DIAGNOSIS — M7918 Myalgia, other site: Secondary | ICD-10-CM | POA: Diagnosis not present

## 2023-12-12 DIAGNOSIS — E291 Testicular hypofunction: Secondary | ICD-10-CM | POA: Diagnosis not present

## 2023-12-18 DIAGNOSIS — M47816 Spondylosis without myelopathy or radiculopathy, lumbar region: Secondary | ICD-10-CM | POA: Diagnosis not present

## 2023-12-25 DIAGNOSIS — E291 Testicular hypofunction: Secondary | ICD-10-CM | POA: Diagnosis not present

## 2023-12-31 DIAGNOSIS — M47816 Spondylosis without myelopathy or radiculopathy, lumbar region: Secondary | ICD-10-CM | POA: Diagnosis not present

## 2023-12-31 DIAGNOSIS — M48062 Spinal stenosis, lumbar region with neurogenic claudication: Secondary | ICD-10-CM | POA: Diagnosis not present

## 2024-01-15 DIAGNOSIS — E291 Testicular hypofunction: Secondary | ICD-10-CM | POA: Diagnosis not present

## 2024-01-16 DIAGNOSIS — D2261 Melanocytic nevi of right upper limb, including shoulder: Secondary | ICD-10-CM | POA: Diagnosis not present

## 2024-01-16 DIAGNOSIS — Z85820 Personal history of malignant melanoma of skin: Secondary | ICD-10-CM | POA: Diagnosis not present

## 2024-01-16 DIAGNOSIS — L821 Other seborrheic keratosis: Secondary | ICD-10-CM | POA: Diagnosis not present

## 2024-01-16 DIAGNOSIS — M7981 Nontraumatic hematoma of soft tissue: Secondary | ICD-10-CM | POA: Diagnosis not present

## 2024-01-16 DIAGNOSIS — L812 Freckles: Secondary | ICD-10-CM | POA: Diagnosis not present

## 2024-01-16 DIAGNOSIS — D225 Melanocytic nevi of trunk: Secondary | ICD-10-CM | POA: Diagnosis not present

## 2024-01-16 DIAGNOSIS — D2272 Melanocytic nevi of left lower limb, including hip: Secondary | ICD-10-CM | POA: Diagnosis not present
